# Patient Record
Sex: Female | Born: 1975 | Hispanic: Yes | Marital: Single | Smoking: Never smoker
Health system: Southern US, Community
[De-identification: ages and names within clinical notes are randomized; demographics above are authoritative.]

## PROBLEM LIST (undated history)

## (undated) ENCOUNTER — Inpatient Hospital Stay: Payer: Self-pay

## (undated) DIAGNOSIS — F410 Panic disorder [episodic paroxysmal anxiety] without agoraphobia: Secondary | ICD-10-CM

## (undated) DIAGNOSIS — F419 Anxiety disorder, unspecified: Secondary | ICD-10-CM

---

## 2012-01-15 ENCOUNTER — Emergency Department: Payer: Self-pay | Admitting: *Deleted

## 2012-01-15 LAB — URINALYSIS, COMPLETE
Bilirubin,UR: NEGATIVE
Glucose,UR: NEGATIVE mg/dL (ref 0–75)
Leukocyte Esterase: NEGATIVE
Nitrite: NEGATIVE
Protein: NEGATIVE
RBC,UR: 1 /HPF (ref 0–5)
Squamous Epithelial: 8
WBC UR: 2 /HPF (ref 0–5)

## 2012-02-03 ENCOUNTER — Encounter: Payer: Self-pay | Admitting: Maternal and Fetal Medicine

## 2012-06-10 ENCOUNTER — Observation Stay: Payer: Self-pay | Admitting: Obstetrics and Gynecology

## 2012-06-26 ENCOUNTER — Inpatient Hospital Stay: Payer: Self-pay | Admitting: Obstetrics and Gynecology

## 2012-06-26 LAB — PIH PROFILE
Anion Gap: 6 — ABNORMAL LOW (ref 7–16)
BUN: 10 mg/dL (ref 7–18)
Calcium, Total: 8.8 mg/dL (ref 8.5–10.1)
Creatinine: 0.55 mg/dL — ABNORMAL LOW (ref 0.60–1.30)
EGFR (African American): >60
EGFR (Non-African Amer.): >60
Glucose: 84 mg/dL (ref 65–99)
HCT: 37 % (ref 35.0–47.0)
MCHC: 31.6 g/dL — ABNORMAL LOW (ref 32.0–36.0)
MCV: 85 fL (ref 80–100)
Platelet: 169 10*3/uL (ref 150–440)
Potassium: 4 mmol/L (ref 3.5–5.1)
RBC: 4.33 10*6/uL (ref 3.80–5.20)
SGOT(AST): 18 U/L (ref 15–37)
Sodium: 136 mmol/L (ref 136–145)

## 2014-06-27 LAB — OB RESULTS CONSOLE HEPATITIS B SURFACE ANTIGEN: Hepatitis B Surface Ag: NEGATIVE

## 2014-06-27 LAB — OB RESULTS CONSOLE ABO/RH: RH Type: NEGATIVE

## 2014-06-27 LAB — OB RESULTS CONSOLE RPR: RPR: NONREACTIVE

## 2014-06-27 LAB — OB RESULTS CONSOLE ANTIBODY SCREEN: Antibody Screen: NEGATIVE

## 2014-06-27 LAB — OB RESULTS CONSOLE VARICELLA ZOSTER ANTIBODY, IGG: Varicella: IMMUNE

## 2014-06-27 LAB — OB RESULTS CONSOLE GC/CHLAMYDIA
Chlamydia: NEGATIVE
Gonorrhea: NEGATIVE

## 2014-06-27 LAB — OB RESULTS CONSOLE HIV ANTIBODY (ROUTINE TESTING): HIV: NONREACTIVE

## 2014-06-27 LAB — OB RESULTS CONSOLE RUBELLA ANTIBODY, IGM: RUBELLA: IMMUNE

## 2014-07-22 ENCOUNTER — Encounter: Payer: Self-pay | Admitting: Obstetrics and Gynecology

## 2014-07-26 NOTE — L&D Delivery Note (Signed)
Obstetrical Delivery Note   Date of Delivery:   01/24/2015 Primary OB:   Other [facility] Gestational Age/EDD: 5081w5d (Dated by LMP c/w 12 week US) Antepartum complications: none  Delivered By:   Jolinda Croakavid Goodman MD  Delivery Type:   spontaneous vaginal delivery  Procedure Details:   Uncomplicated SVD Anesthesia:    none Intrapartum complications: None GBS:    Negative Laceration:    none Episiotomy:    none Placenta:    Via active 3rd stage. Intact: yes. To pathology: no.  Estimated Blood Loss:  300 cc  Baby:    Liveborn female, APGARs 8/9, weight PENDING gm  600 mcg of misoprostol given due to parity and increased risk of PPH.   Teena Iraniavid M. Derrill KayGoodman MD, MPH [facility] [phone]

## 2014-08-13 ENCOUNTER — Emergency Department: Payer: Self-pay | Admitting: Emergency Medicine

## 2014-08-13 LAB — URINALYSIS, COMPLETE
BILIRUBIN, UR: NEGATIVE
Bacteria: NONE SEEN
Glucose,UR: 50 mg/dL (ref 0–75)
Ketone: NEGATIVE
LEUKOCYTE ESTERASE: NEGATIVE
Nitrite: NEGATIVE
PH: 6 (ref 4.5–8.0)
Protein: NEGATIVE
Specific Gravity: 1.024 (ref 1.003–1.030)

## 2014-08-13 LAB — LIPASE, BLOOD: LIPASE: 87 U/L (ref 73–393)

## 2014-08-13 LAB — CBC
HCT: 39.3 % (ref 35.0–47.0)
HGB: 12.6 g/dL (ref 12.0–16.0)
MCH: 29.4 pg (ref 26.0–34.0)
MCHC: 32 g/dL (ref 32.0–36.0)
MCV: 92 fL (ref 80–100)
Platelet: 160 10*3/uL (ref 150–440)
RBC: 4.28 10*6/uL (ref 3.80–5.20)
RDW: 14.4 % (ref 11.5–14.5)
WBC: 10.4 10*3/uL (ref 3.6–11.0)

## 2014-08-13 LAB — COMPREHENSIVE METABOLIC PANEL
ALBUMIN: 2.8 g/dL — AB (ref 3.4–5.0)
ALT: 25 U/L
ANION GAP: 10 (ref 7–16)
Alkaline Phosphatase: 58 U/L
BUN: 5 mg/dL — AB (ref 7–18)
Bilirubin,Total: 0.2 mg/dL (ref 0.2–1.0)
CALCIUM: 8.4 mg/dL — AB (ref 8.5–10.1)
Chloride: 108 mmol/L — ABNORMAL HIGH (ref 98–107)
Co2: 21 mmol/L (ref 21–32)
Creatinine: 0.67 mg/dL (ref 0.60–1.30)
EGFR (African American): >60
GLUCOSE: 135 mg/dL — AB (ref 65–99)
Osmolality: 277 (ref 275–301)
POTASSIUM: 3.9 mmol/L (ref 3.5–5.1)
SGOT(AST): 16 U/L (ref 15–37)
Sodium: 139 mmol/L (ref 136–145)
Total Protein: 6.9 g/dL (ref 6.4–8.2)

## 2014-08-13 LAB — HCG, QUANTITATIVE, PREGNANCY: Beta Hcg, Quant.: 23717 m[IU]/mL — ABNORMAL HIGH

## 2014-08-22 ENCOUNTER — Encounter: Payer: Self-pay | Admitting: Obstetrics and Gynecology

## 2014-08-26 ENCOUNTER — Encounter: Payer: Self-pay | Admitting: Obstetrics and Gynecology
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
## 2015-01-10 ENCOUNTER — Encounter: Payer: Self-pay | Admitting: *Deleted

## 2015-01-10 ENCOUNTER — Observation Stay
Admission: EM | Admit: 2015-01-10 | Discharge: 2015-01-10 | Disposition: A | Discharge status: Home / Self Care | Payer: Self-pay | Attending: Obstetrics and Gynecology | Admitting: Obstetrics and Gynecology

## 2015-01-10 DIAGNOSIS — O479 False labor, unspecified: Secondary | ICD-10-CM

## 2015-01-10 NOTE — OB Triage Provider Note (Signed)
NST reactive with 2 accels 15 x 15 BPM.  

## 2015-01-10 NOTE — OB Triage Provider Note (Signed)
Stacy Watson is a 39 y.o. female presenting for "UC's becoming sl uncomfortable today, no ROM, no VB or decresed FM. PNC at [facility] significant for elederly multigravida and needle phobia.  Maternal Medical History:  Reason for admission: Contractions.   Contractions: Onset was 1-2 hours ago.   Frequency: regular.   Duration is approximately 20 seconds.    Fetal activity: Perceived fetal activity is normal.    Prenatal complications: No bleeding, IUGR, polyhydramnios or preterm labor.     OB History    Gravida Para Term Preterm AB TAB SAB Ectopic Multiple Living   7 5 5  0 1  1   5     PMH: depression No past surgical history on file. Family History: family history is not on file. Social History:  reports that she has never smoked. She has never used smokeless tobacco. She reports that she does not drink alcohol or use illicit drugs. Allergies: NKA  Prenatal Transfer Tool  Maternal Diabetes: No Genetic Screening: Normal Maternal Ultrasounds/Referrals: Normal Fetal Ultrasounds or other Referrals:  None Maternal Substance Abuse:  No Significant Maternal Medications:  None Significant Maternal Lab Results:  None Other Comments:  n/a  ROS  Dilation: 1 Effacement (%): Thick Station: Ballotable Exam by:: g kirby rnc Blood pressure 128/62, pulse 114, temperature 98.2 F (36.8 C), temperature source Oral, resp. rate 20, height 5\' 6"  (1.676 m), weight 83.915 kg (185 lb), last menstrual period 04/21/2014. Exam Physical Exam  Constitutional: She is oriented to person, place, and time. She appears well-developed and well-nourished.  HENT:  Head: Normocephalic.  Eyes: Pupils are equal, round, and reactive to light.  Neck: Normal range of motion. Neck supple.  Cardiovascular: Normal rate, regular rhythm and normal heart sounds.   Respiratory: Effort normal and breath sounds normal.  Musculoskeletal: Normal range of motion.  Neurological: She is alert and oriented to person,  place, and time.  Skin: Skin is warm and dry.  Psychiatric: She has a normal mood and affect.    Prenatal labs: ABO, Rh: O/Negative/-- (12/03 0000) Antibody: Negative (12/03 0000) Rubella: Immune (12/03 0000) RPR: Nonreactive (12/03 0000)  HBsAg: Negative (12/03 0000)  HIV: Non-reactive (12/03 0000)  GBS:  neg Assessment/Plan: A: 1. IUP at 37 weeks 2. Elderly Multip with B-H's P: DC home to rest 2. Fu with decreased FM, UC's becoming more intense, ROM or VB.  Stacy Watson W 01/10/2015, 1:08 PM

## 2015-01-10 NOTE — OB Triage Note (Signed)
Presents with complaints of contractions that started yesterday, now every 10 minutes. States some mucous discharge

## 2015-01-22 NOTE — H&P (Signed)
Stacy Watson, Stacy Watson Female 04/10/1976 [id number]    OB Triage Provider Note by Sharee Pimplearon W Sarra Rachels, CNM at 01/10/2015 1:08 PM    Author: Sharee Pimplearon W Stephaun Million, CNM Service: Obstetrics Author Type: Certified Midwife   Filed: 01/10/2015 1:18 PM Note Time: 01/10/2015 1:08 PM Status: Signed   Editor: Sharee Pimplearon W Davarius Ridener, CNM (Certified Midwife)     Related Notes: Original Note by Sharee Pimplearon W Keyetta Hollingworth, CNM (Certified Midwife) filed at 01/10/2015 1:17 PM   Cosigner: Suzy Bouchardhomas J Schermerhorn, MD at 01/14/2015 9:08 AM       Expand All Collapse All   Stacy Watson is a 39 y.o. female presenting for "UC's becoming sl uncomfortable today, no ROM, no VB or decresed FM. PNC at [facility] significant for elederly multigravida and needle phobia.  Maternal Medical History:  Reason for admission: Contractions.   Contractions: Onset was 1-2 hours ago.  Frequency: regular.  Duration is approximately 20 seconds.   Fetal activity: Perceived fetal activity is normal.   Prenatal complications: No bleeding, IUGR, polyhydramnios or preterm labor.   OB History    Gravida Para Term Preterm AB TAB SAB Ectopic Multiple Living   7 5 5  0 1  1   5     PMH: depression No past surgical history on file. Family History: family history is not on file. Social History:  reports that she has never smoked. She has never used smokeless tobacco. She reports that she does not drink alcohol or use illicit drugs. Allergies: NKA  Prenatal Transfer Tool  Maternal Diabetes: No Genetic Screening: Normal Maternal Ultrasounds/Referrals: Normal Fetal Ultrasounds or other Referrals: None Maternal Substance Abuse: No Significant Maternal Medications: None Significant Maternal Lab Results: None Other Comments: n/a  ROS  Dilation: 1 Effacement (%): Thick Station: Ballotable Exam by:: g kirby rnc Blood pressure 128/62, pulse 114, temperature 98.2 F (36.8 C), temperature source Oral, resp. rate 20, height  5\' 6"  (1.676 m), weight 83.915 kg (185 lb), last menstrual period 04/21/2014. Exam Physical Exam  Constitutional: She is oriented to person, place, and time. She appears well-developed and well-nourished.  HENT:  Head: Normocephalic.  Eyes: Pupils are equal, round, and reactive to light.  Neck: Normal range of motion. Neck supple.  Cardiovascular: Normal rate, regular rhythm and normal heart sounds.  Respiratory: Effort normal and breath sounds normal.  Musculoskeletal: Normal range of motion.  Neurological: She is alert and oriented to person, place, and time.  Skin: Skin is warm and dry.  Psychiatric: She has a normal mood and affect.    Prenatal labs: ABO, Rh: O/Negative/-- (12/03 0000) Antibody: Negative (12/03 0000) Rubella: Immune (12/03 0000) RPR: Nonreactive (12/03 0000)  HBsAg: Negative (12/03 0000)  HIV: Non-reactive (12/03 0000)  GBS:  neg Assessment/Plan: A: 1. IUP at 37 weeks 2. Elderly Multip with B-H's P: DC home to rest 2. Fu with decreased FM, UC's becoming more intense, ROM or VB.  Schyler Counsell W 01/10/2015, 1:08 PM

## 2015-01-24 ENCOUNTER — Inpatient Hospital Stay
Admission: EM | Admit: 2015-01-24 | Discharge: 2015-01-26 | DRG: 767 | Disposition: A | Discharge status: Home / Self Care | Payer: Medicaid Other | Attending: Obstetrics and Gynecology | Admitting: Obstetrics and Gynecology

## 2015-01-24 ENCOUNTER — Encounter: Payer: Self-pay | Admitting: *Deleted

## 2015-01-24 DIAGNOSIS — Z3A39 39 weeks gestation of pregnancy: Secondary | ICD-10-CM | POA: Diagnosis present

## 2015-01-24 DIAGNOSIS — Z302 Encounter for sterilization: Secondary | ICD-10-CM

## 2015-01-24 DIAGNOSIS — Z3483 Encounter for supervision of other normal pregnancy, third trimester: Secondary | ICD-10-CM | POA: Diagnosis present

## 2015-01-24 LAB — TYPE AND SCREEN
ABO/RH(D): O POS
Antibody Screen: NEGATIVE

## 2015-01-24 LAB — CBC
HEMATOCRIT: 35.4 % (ref 35.0–47.0)
Hemoglobin: 11.5 g/dL — ABNORMAL LOW (ref 12.0–16.0)
MCH: 27.9 pg (ref 26.0–34.0)
MCHC: 32.6 g/dL (ref 32.0–36.0)
MCV: 85.7 fL (ref 80.0–100.0)
Platelets: 140 10*3/uL — ABNORMAL LOW (ref 150–440)
RBC: 4.13 MIL/uL (ref 3.80–5.20)
RDW: 17 % — ABNORMAL HIGH (ref 11.5–14.5)
WBC: 11.3 10*3/uL — ABNORMAL HIGH (ref 3.6–11.0)

## 2015-01-24 LAB — ABO/RH: ABO/RH(D): O POS

## 2015-01-24 SURGERY — Surgical Case
Anesthesia: *Unknown

## 2015-01-24 MED ORDER — BUTORPHANOL TARTRATE 1 MG/ML IJ SOLN: 1.0000 mg | INTRAMUSCULAR | Status: DC | PRN

## 2015-01-24 MED ORDER — OXYTOCIN 10 UNIT/ML IJ SOLN
INTRAMUSCULAR | Status: AC
  Filled 2015-01-24: qty 2

## 2015-01-24 MED ORDER — LIDOCAINE HCL (PF) 1 % IJ SOLN
30.0000 mL | INTRAMUSCULAR | Status: DC | PRN
Start: 2015-01-24 — End: 2015-01-24
  Filled 2015-01-24: qty 30

## 2015-01-24 MED ORDER — OXYTOCIN 40 UNITS IN LACTATED RINGERS INFUSION - SIMPLE MED
INTRAVENOUS | Status: AC
  Filled 2015-01-24: qty 1000

## 2015-01-24 MED ORDER — ACETAMINOPHEN 325 MG PO TABS
ORAL_TABLET | ORAL | Status: AC
  Administered 2015-01-24: 650 mg via ORAL
  Filled 2015-01-24: qty 2

## 2015-01-24 MED ORDER — ONDANSETRON HCL 4 MG/2ML IJ SOLN
4.0000 mg | Freq: Four times a day (QID) | INTRAMUSCULAR | Status: DC | PRN
Start: 2015-01-24 — End: 2015-01-24

## 2015-01-24 MED ORDER — OXYCODONE-ACETAMINOPHEN 5-325 MG PO TABS
2.0000 | ORAL_TABLET | ORAL | Status: DC | PRN
  Administered 2015-01-24 – 2015-01-26 (×3): 2 via ORAL
  Filled 2015-01-24 (×3): qty 2

## 2015-01-24 MED ORDER — WITCH HAZEL-GLYCERIN EX PADS: 1.0000 "application " | MEDICATED_PAD | CUTANEOUS | Status: DC | PRN

## 2015-01-24 MED ORDER — PRENATAL MULTIVITAMIN CH: 1.0000 | ORAL_TABLET | Freq: Every day | ORAL | Status: DC

## 2015-01-24 MED ORDER — LANOLIN HYDROUS EX OINT: TOPICAL_OINTMENT | CUTANEOUS | Status: DC | PRN

## 2015-01-24 MED ORDER — ONDANSETRON HCL 4 MG PO TABS: 4.0000 mg | ORAL_TABLET | ORAL | Status: DC | PRN

## 2015-01-24 MED ORDER — LIDOCAINE HCL (PF) 1 % IJ SOLN
INTRAMUSCULAR | Status: AC
  Filled 2015-01-24: qty 30

## 2015-01-24 MED ORDER — CITRIC ACID-SODIUM CITRATE 334-500 MG/5ML PO SOLN: 30.0000 mL | ORAL | Status: DC | PRN

## 2015-01-24 MED ORDER — LACTATED RINGERS IV SOLN: INTRAVENOUS | Status: DC

## 2015-01-24 MED ORDER — OXYTOCIN 40 UNITS IN LACTATED RINGERS INFUSION - SIMPLE MED
62.5000 mL/h | INTRAVENOUS | Status: DC
  Administered 2015-01-24: 62.5 mL/h via INTRAVENOUS

## 2015-01-24 MED ORDER — SIMETHICONE 80 MG PO CHEW: 80.0000 mg | CHEWABLE_TABLET | ORAL | Status: DC | PRN

## 2015-01-24 MED ORDER — LACTATED RINGERS IV SOLN: 500.0000 mL | INTRAVENOUS | Status: DC | PRN

## 2015-01-24 MED ORDER — IBUPROFEN 600 MG PO TABS
600.0000 mg | ORAL_TABLET | Freq: Four times a day (QID) | ORAL | Status: DC
  Administered 2015-01-24 – 2015-01-26 (×6): 600 mg via ORAL
  Filled 2015-01-24 (×6): qty 1

## 2015-01-24 MED ORDER — OXYTOCIN BOLUS FROM INFUSION: 250.0000 mL | INTRAVENOUS | Status: DC

## 2015-01-24 MED ORDER — ONDANSETRON HCL 4 MG/2ML IJ SOLN
4.0000 mg | INTRAMUSCULAR | Status: DC | PRN
  Administered 2015-01-25: 4 mg via INTRAVENOUS

## 2015-01-24 MED ORDER — DIPHENHYDRAMINE HCL 25 MG PO CAPS: 25.0000 mg | ORAL_CAPSULE | Freq: Four times a day (QID) | ORAL | Status: DC | PRN

## 2015-01-24 MED ORDER — SODIUM CHLORIDE 0.9 % IV SOLN
INTRAVENOUS | Status: DC
  Administered 2015-01-25 (×2): via INTRAVENOUS

## 2015-01-24 MED ORDER — ACETAMINOPHEN 325 MG PO TABS
650.0000 mg | ORAL_TABLET | ORAL | Status: DC | PRN
  Administered 2015-01-24: 650 mg via ORAL

## 2015-01-24 MED ORDER — BENZOCAINE-MENTHOL 20-0.5 % EX AERO: 1.0000 "application " | INHALATION_SPRAY | CUTANEOUS | Status: DC | PRN

## 2015-01-24 MED ORDER — DIBUCAINE 1 % RE OINT: 1.0000 "application " | TOPICAL_OINTMENT | RECTAL | Status: DC | PRN

## 2015-01-24 MED ORDER — ZOLPIDEM TARTRATE 5 MG PO TABS: 5.0000 mg | ORAL_TABLET | Freq: Every evening | ORAL | Status: DC | PRN

## 2015-01-24 MED ORDER — MISOPROSTOL 200 MCG PO TABS
ORAL_TABLET | ORAL | Status: AC
  Administered 2015-01-24: 600 ug
  Filled 2015-01-24: qty 4

## 2015-01-24 MED ORDER — OXYTOCIN 40 UNITS IN LACTATED RINGERS INFUSION - SIMPLE MED: 125.0000 mL/h | INTRAVENOUS | Status: DC | PRN

## 2015-01-24 MED ORDER — OXYCODONE-ACETAMINOPHEN 5-325 MG PO TABS
1.0000 | ORAL_TABLET | ORAL | Status: DC | PRN
  Administered 2015-01-25 – 2015-01-26 (×2): 1 via ORAL
  Filled 2015-01-24 (×2): qty 1

## 2015-01-24 MED ORDER — SENNOSIDES-DOCUSATE SODIUM 8.6-50 MG PO TABS
2.0000 | ORAL_TABLET | ORAL | Status: DC
  Administered 2015-01-25 – 2015-01-26 (×2): 2 via ORAL
  Filled 2015-01-24 (×2): qty 2

## 2015-01-24 MED ORDER — TETANUS-DIPHTH-ACELL PERTUSSIS 5-2.5-18.5 LF-MCG/0.5 IM SUSP: 0.5000 mL | Freq: Once | INTRAMUSCULAR | Status: DC

## 2015-01-24 MED ORDER — AMMONIA AROMATIC IN INHA
RESPIRATORY_TRACT | Status: AC
  Filled 2015-01-24: qty 10
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
## 2015-01-24 NOTE — Progress Notes (Signed)
S: Resting comfortably without epidural. + CTX, no LOF, VB O: Temp:  [97.7 F (36.5 C)-98.7 F (37.1 C)] 98.7 F (37.1 C) (07/01 0722) Pulse Rate:  [107-116] 112 (07/01 0722) Resp:  [18-22] 22 (07/01 0722) BP: (128-138)/(68-90) 128/90 mmHg (07/01 0722) SpO2:  [98 %-99 %] 98 % (07/01 0755) Weight:  [83.915 kg (185 lb)] 83.915 kg (185 lb) (07/01 0634)   Gen: NAD, AAOx3      Abd: FNTTP      Ext: Non-tender, Nonedmeatous    FHT: 140s mod var + accelerations no decelerations TOCO: Q2-3 min SVE: 4/50/-2   A/P:  39 y.o. yo [id number] at 579w5d by LMP c/w 12 week US with labor.   Labor: AROM'd clear, head well applied  FWB: Reassuring Cat 1 tracing. EFW 7.5lbs  GBS: neg  Contraception: not discussed   GOODMAN, DAVID MICHAEL 9:58 AM

## 2015-01-25 ENCOUNTER — Encounter
Admission: EM | Disposition: A | Discharge status: Home / Self Care | Payer: Self-pay | Source: Home / Self Care | Attending: Obstetrics and Gynecology

## 2015-01-25 ENCOUNTER — Inpatient Hospital Stay: Payer: Medicaid Other | Admitting: Anesthesiology

## 2015-01-25 ENCOUNTER — Encounter: Payer: Self-pay | Admitting: Anesthesiology

## 2015-01-25 HISTORY — PX: TUBAL LIGATION: SHX77

## 2015-01-25 LAB — CBC
HEMATOCRIT: 34.8 % — AB (ref 35.0–47.0)
HEMOGLOBIN: 11 g/dL — AB (ref 12.0–16.0)
MCH: 27.4 pg (ref 26.0–34.0)
MCHC: 31.6 g/dL — ABNORMAL LOW (ref 32.0–36.0)
MCV: 86.5 fL (ref 80.0–100.0)
PLATELETS: 128 10*3/uL — AB (ref 150–440)
RBC: 4.02 MIL/uL (ref 3.80–5.20)
RDW: 17.4 % — ABNORMAL HIGH (ref 11.5–14.5)
WBC: 11.5 10*3/uL — ABNORMAL HIGH (ref 3.6–11.0)

## 2015-01-25 LAB — RPR: RPR: NONREACTIVE

## 2015-01-25 SURGERY — LIGATION, FALLOPIAN TUBE, BILATERAL
Anesthesia: General | Laterality: Bilateral

## 2015-01-25 MED ORDER — BUPIVACAINE HCL 0.5 % IJ SOLN
INTRAMUSCULAR | Status: DC | PRN
  Administered 2015-01-25: 3 mL

## 2015-01-25 MED ORDER — FENTANYL CITRATE (PF) 100 MCG/2ML IJ SOLN
25.0000 ug | INTRAMUSCULAR | Status: DC | PRN
  Administered 2015-01-25 (×2): 25 ug via INTRAVENOUS

## 2015-01-25 MED ORDER — BUPIVACAINE HCL (PF) 0.5 % IJ SOLN
INTRAMUSCULAR | Status: AC
  Filled 2015-01-25: qty 30

## 2015-01-25 MED ORDER — DEXAMETHASONE SODIUM PHOSPHATE 4 MG/ML IJ SOLN
INTRAMUSCULAR | Status: DC | PRN
  Administered 2015-01-25: 10 mg via INTRAVENOUS

## 2015-01-25 MED ORDER — FENTANYL CITRATE (PF) 100 MCG/2ML IJ SOLN
INTRAMUSCULAR | Status: DC | PRN
  Administered 2015-01-25: 100 ug via INTRAVENOUS

## 2015-01-25 MED ORDER — LIDOCAINE HCL (CARDIAC) 20 MG/ML IV SOLN
INTRAVENOUS | Status: DC | PRN
  Administered 2015-01-25: 100 mg via INTRAVENOUS

## 2015-01-25 MED ORDER — GLYCOPYRROLATE 0.2 MG/ML IJ SOLN
INTRAMUSCULAR | Status: DC | PRN
  Administered 2015-01-25: 0.6 mg via INTRAVENOUS

## 2015-01-25 MED ORDER — FENTANYL CITRATE (PF) 100 MCG/2ML IJ SOLN
INTRAMUSCULAR | Status: AC
  Filled 2015-01-25: qty 2

## 2015-01-25 MED ORDER — ROCURONIUM BROMIDE 100 MG/10ML IV SOLN
INTRAVENOUS | Status: DC | PRN
  Administered 2015-01-25: 30 mg via INTRAVENOUS
  Administered 2015-01-25: 10 mg via INTRAVENOUS

## 2015-01-25 MED ORDER — ONDANSETRON HCL 4 MG/2ML IJ SOLN: 4.0000 mg | Freq: Once | INTRAMUSCULAR | Status: DC | PRN

## 2015-01-25 MED ORDER — NEOSTIGMINE METHYLSULFATE 10 MG/10ML IV SOLN
INTRAVENOUS | Status: DC | PRN
  Administered 2015-01-25: 4 mg via INTRAVENOUS

## 2015-01-25 MED ORDER — SUCCINYLCHOLINE CHLORIDE 20 MG/ML IJ SOLN
INTRAMUSCULAR | Status: DC | PRN
  Administered 2015-01-25: 100 mg via INTRAVENOUS

## 2015-01-25 MED ORDER — PROPOFOL 10 MG/ML IV BOLUS
INTRAVENOUS | Status: DC | PRN
  Administered 2015-01-25: 200 mg via INTRAVENOUS

## 2015-01-25 MED ORDER — MIDAZOLAM HCL 2 MG/2ML IJ SOLN
INTRAMUSCULAR | Status: DC | PRN
  Administered 2015-01-25: 2 mg via INTRAVENOUS

## 2015-01-25 MED ORDER — LACTATED RINGERS IV SOLN
INTRAVENOUS | Status: DC | PRN
  Administered 2015-01-25: 17:00:00 via INTRAVENOUS

## 2015-01-25 SURGICAL SUPPLY — 30 items
CHLORAPREP W/TINT 26ML (MISCELLANEOUS) ×3 IMPLANT
DERMABOND ADVANCED (GAUZE/BANDAGES/DRESSINGS) ×2
DERMABOND ADVANCED .7 DNX12 (GAUZE/BANDAGES/DRESSINGS) ×1 IMPLANT
DRAPE LAPAROTOMY T 102X78X121 (DRAPES) ×3 IMPLANT
DRESSING TELFA 4X3 1S ST N-ADH (GAUZE/BANDAGES/DRESSINGS) ×3 IMPLANT
DRSG TEGADERM 2-3/8X2-3/4 SM (GAUZE/BANDAGES/DRESSINGS) IMPLANT
DRSG TEGADERM 2X2.25 PEDS (GAUZE/BANDAGES/DRESSINGS) ×3 IMPLANT
ELECT CAUTERY BLADE 6.4 (BLADE) ×3 IMPLANT
GAUZE SPONGE NON-WVN 2X2 STRL (MISCELLANEOUS) ×1 IMPLANT
GLOVE BIO SURGEON STRL SZ8 (GLOVE) IMPLANT
GOWN STRL REUS W/ TWL LRG LVL3 (GOWN DISPOSABLE) ×2 IMPLANT
GOWN STRL REUS W/ TWL XL LVL3 (GOWN DISPOSABLE) IMPLANT
GOWN STRL REUS W/TWL LRG LVL3 (GOWN DISPOSABLE) ×4
GOWN STRL REUS W/TWL XL LVL3 (GOWN DISPOSABLE)
LABEL OR SOLS (LABEL) ×3 IMPLANT
LIQUID BAND (GAUZE/BANDAGES/DRESSINGS) ×3 IMPLANT
NDL SAFETY 22GX1.5 (NEEDLE) ×3 IMPLANT
NS IRRIG 500ML POUR BTL (IV SOLUTION) ×3 IMPLANT
PACK BASIN MINOR ARMC (MISCELLANEOUS) ×3 IMPLANT
PAD GROUND ADULT SPLIT (MISCELLANEOUS) ×3 IMPLANT
SPONGE LAP 4X18 5PK (MISCELLANEOUS) ×3 IMPLANT
SPONGE VERSALON 2X2 STRL (MISCELLANEOUS) ×2
STRAP SAFETY BODY (MISCELLANEOUS) ×3 IMPLANT
SUT PLAIN 2 0 (SUTURE) ×4
SUT PLAIN ABS 2-0 CT1 27XMFL (SUTURE) ×2 IMPLANT
SUT VIC AB 0 SH 27 (SUTURE) IMPLANT
SUT VIC AB 2-0 UR6 27 (SUTURE) IMPLANT
SUT VIC AB 4-0 PS2 18 (SUTURE) IMPLANT
SUT VICRYL 0 AB UR-6 (SUTURE) ×6 IMPLANT
SYRINGE 10CC LL (SYRINGE) ×3 IMPLANT

## 2015-01-25 NOTE — Op Note (Signed)
Operatative Note  Pre-Op Dx:   Desires Sterilization  Post-Op Dx:   same  Procedure:   Bilateral tubal ligation  Surgeon:  Jolinda Croakavid Goodman MD, MPH Assistant(s):  None Staff:  Circulator: Casimiro NeedleSharon G Moore, RN Scrub Person: Windell NorfolkKari S Pedersen  Anesthesia:  GETA  Antibiotics:  None  DVT prophylaxis: SCDs   EBL:  25cc   Specimen(s):    ID Type Source Tests Collected by Time Destination  1 : left fallopian tube segment GYN Fallopian Tube, Left SURGICAL PATHOLOGY Wille Celesteavid Michael Goodman, MD 01/25/2015 1715   2 : right fallopian tube segment GYN Fallopian Tube, Right SURGICAL PATHOLOGY Wille Celesteavid Michael Goodman, MD 01/25/2015 1710     Findings:  ASA 2. Wound class 1 Uterus: Postpartum with fundus near umbilicus Ovaries and Tubes: Edematous tubes bilaterally Adhesions: None  Complications:  none   Disposition:  Extubated to PACU.   Tech The patient was taken to the operating room where her general anesthesia was found to be adequate.  She was then placed in the dorsal supine position and prepped and draped in sterile fashion.  After an adequate timeout was performed, attention was turned to the patient's abdomen where the skin was infiltrated with 4 cc of 0.5% bupivacaine and a small transverse skin incision was made under the umbilical fold. The incision was taken down to the layer of fascia using the scalpel, and fascia was incised, and extended vertically using Mayo scissors. The peritoneum was entered in a sharp fashion. Attention was then turned to the patient's uterus, and left fallopian tube was identified and followed out to the fimbriated end.  The tube was grasped 3 cm from the cornual region and doubly ligated using 2-0 plain gut suture. Hemostasis was noted.   A similar process was carried out on the right side allowing for bilateral tubal sterilization.  Good hemostasis was noted overall. .The instruments were then removed from the patient's abdomen and the fascial incision was  repaired with 0 Vicryl, and the skin was closed with a 4-0 Vicryl subcuticular stitch. The patient tolerated the procedure well.  Instrument, sponge, and needle counts were correct times two.  The patient was then taken to the recovery room awake and in stable condition.   Teena Iraniavid M. Derrill KayGoodman MD, MPH [facility] [phone]

## 2015-01-25 NOTE — Anesthesia Preprocedure Evaluation (Signed)
Anesthesia Evaluation  Patient identified by MRN, date of birth, ID band Patient awake    Reviewed: Allergy & Precautions, Patient's Chart, lab work & pertinent test results  Airway Mallampati: III  TM Distance: >3 FB Neck ROM: Full    Dental  (+) Caps, Chipped   Pulmonary neg pulmonary ROS,  breath sounds clear to auscultation  Pulmonary exam normal       Cardiovascular negative cardio ROS Normal cardiovascular exam    Neuro/Psych negative neurological ROS     GI/Hepatic negative GI ROS, Neg liver ROS,   Endo/Other  negative endocrine ROS  Renal/GU negative Renal ROS  negative genitourinary   Musculoskeletal negative musculoskeletal ROS (+)   Abdominal Normal abdominal exam  (+) + obese,   Peds negative pediatric ROS (+)  Hematology  (+) anemia ,   Anesthesia Other Findings   Reproductive/Obstetrics                             Anesthesia Physical Anesthesia Plan  ASA: II  Anesthesia Plan: General   Post-op Pain Management:    Induction: Intravenous and Rapid sequence  Airway Management Planned: Oral ETT  Additional Equipment:   Intra-op Plan:   Post-operative Plan: Extubation in OR  Informed Consent: I have reviewed the patients History and Physical, chart, labs and discussed the procedure including the risks, benefits and alternatives for the proposed anesthesia with the patient or authorized representative who has indicated his/her understanding and acceptance.   Dental advisory given  Plan Discussed with: Surgeon  Anesthesia Plan Comments:         Anesthesia Quick Evaluation

## 2015-01-25 NOTE — Anesthesia Postprocedure Evaluation (Signed)
  Anesthesia Post-op Note  Patient: [name]  Procedure(s) Performed: Procedure(s): BILATERAL TUBAL LIGATION (Bilateral)  Anesthesia type:General  Patient location: PACU  Post pain: Pain level controlled  Post assessment: Post-op Vital signs reviewed, Patient's Cardiovascular Status Stable, Respiratory Function Stable, Patent Airway and No signs of Nausea or vomiting  Post vital signs: Reviewed and stable  Last Vitals:  Filed Vitals:   01/25/15 1811  BP: 112/91  Pulse:   Temp:   Resp:     Level of consciousness: awake, alert  and patient cooperative  Complications: No apparent anesthesia complications

## 2015-01-25 NOTE — Progress Notes (Signed)
Post Partum Day 1 Subjective: up ad lib, voiding, tolerating PO, + flatus and Concerned about bilateral pain in quadraceps making it hard to walk or stand  Objective: Blood pressure 123/80, pulse 98, temperature 97.8 F (36.6 C), temperature source Oral, resp. rate 18, height 5\' 6"  (1.676 m), weight 83.915 kg (185 lb), last menstrual period 04/21/2014, SpO2 100 %, unknown if currently breastfeeding.  Physical Exam:  General: alert, cooperative and no distress Lochia: appropriate Uterine Fundus: firm Incision: None DVT Evaluation: No evidence of DVT seen on physical exam. Negative Holman's. Sensation intact, Normal reflexes   Recent Labs  01/24/15 0652 01/25/15 0456  HGB 11.5* 11.0*  HCT 35.4 34.8*    Assessment/Plan: Plan for discharge tomorrow, Discharge home, Breastfeeding and Contraception Desires permanent sterilization - Discussed risks/ benefits/alternatives to permanent sterilization and reviewed including alternative LARC methods. Risks include but aren't limited to regret, bleeding, infection, damage to surrounding organs, <1% failure rate, and high risk of ectopic pregnancy in setting of failure. Pt instructed to seek MD if she should ever think that she is pregnant in the future. Tubal papers signed today, self-pay.     LOS: 1 day   GOODMAN, DAVID MICHAEL 01/25/2015, 9:19 AM

## 2015-01-25 NOTE — Transfer of Care (Signed)
Immediate Anesthesia Transfer of Care Note  Patient: Stacy Watson  Procedure(s) Performed: Procedure(s): BILATERAL TUBAL LIGATION (Bilateral)  Patient Location: PACU  Anesthesia Type:General  Level of Consciousness: awake  Airway & Oxygen Therapy: Patient Spontanous Breathing  Post-op Assessment: Report given to RN  Post vital signs: stable  Last Vitals:  Filed Vitals:   01/25/15 1224  BP:   Pulse:   Temp: 36.3 C  Resp:     Complications: No apparent anesthesia complications

## 2015-01-25 NOTE — Anesthesia Procedure Notes (Signed)
Procedure Name: Intubation Date/Time: 01/25/2015 4:54 PM Performed by: Rosaria FerriesPISCITELLO, JOSEPH K Pre-anesthesia Checklist: Patient identified, Patient being monitored, Timeout performed, Emergency Drugs available and Suction available Patient Re-evaluated:Patient Re-evaluated prior to inductionOxygen Delivery Method: Circle system utilized Preoxygenation: Pre-oxygenation with 100% oxygen Intubation Type: IV induction Ventilation: Mask ventilation without difficulty Laryngoscope Size: Mac and 3 Grade View: Grade II Tube type: Oral Tube size: 7.0 mm Number of attempts: 1 Airway Equipment and Method: Stylet Placement Confirmation: ETT inserted through vocal cords under direct vision,  positive ETCO2 and breath sounds checked- equal and bilateral Secured at: 21 cm Tube secured with: Tape Dental Injury: Teeth and Oropharynx as per pre-operative assessment

## 2015-01-26 LAB — CBC
HCT: 32.8 % — ABNORMAL LOW (ref 35.0–47.0)
Hemoglobin: 10.5 g/dL — ABNORMAL LOW (ref 12.0–16.0)
MCH: 27.6 pg (ref 26.0–34.0)
MCHC: 31.9 g/dL — ABNORMAL LOW (ref 32.0–36.0)
MCV: 86.4 fL (ref 80.0–100.0)
Platelets: 150 10*3/uL (ref 150–440)
RBC: 3.8 MIL/uL (ref 3.80–5.20)
RDW: 17.5 % — ABNORMAL HIGH (ref 11.5–14.5)
WBC: 14.7 10*3/uL — AB (ref 3.6–11.0)

## 2015-01-26 MED ORDER — OXYCODONE-ACETAMINOPHEN 5-325 MG PO TABS: 1.0000 | ORAL_TABLET | ORAL | Status: AC | PRN

## 2015-01-26 MED ORDER — IBUPROFEN 200 MG PO TABS: 600.0000 mg | ORAL_TABLET | Freq: Three times a day (TID) | ORAL | Status: AC | PRN

## 2015-01-26 NOTE — Discharge Instructions (Signed)
FU at 6 weeks [facility]. Pt had BTL. No sex or anything in the vagina x 6 weeks. No driving x 2 weeks

## 2015-01-26 NOTE — Discharge Summary (Signed)
Obstetric Discharge Summary Reason for Admission: onset of labor Prenatal Procedures: ultrasound Intrapartum Procedures: spontaneous vaginal delivery Postpartum Procedures: P.P. tubal ligation Complications-Operative and Postpartum: none HEMOGLOBIN  Date Value Ref Range Status  01/26/2015 10.5* 12.0 - 16.0 g/dL Final   HGB  Date Value Ref Range Status  08/13/2014 12.6 12.0-16.0 g/dL Final   HCT  Date Value Ref Range Status  01/26/2015 32.8* 35.0 - 47.0 % Final  08/13/2014 39.3 35.0-47.0 % Final    Physical Exam:  General: alert Lochia: appropriate Uterine Fundus: firm Incision: healing well DVT Evaluation: No evidence of DVT seen on physical exam.  Discharge Diagnoses: Term Pregnancy-delivered  Discharge Information: Date: 01/26/2015 Activity: unrestricted Diet: routine Medications: PNV, Ibuprofen and Vicodin Condition: stable Instructions: call for fever, pain that is not resolving with meds, worsening of bleeding or any other concerns Discharge to: home   Newborn Data: Live born female  Birth Weight: 8 lb 3.6 oz (3730 g) APGAR: 9, 9  Home with mother.  Milon ScoreJONES, Tommy Minichiello W 01/26/2015, 9:41 AM

## 2015-01-28 ENCOUNTER — Encounter: Payer: Self-pay | Admitting: Obstetrics and Gynecology

## 2015-01-29 LAB — SURGICAL PATHOLOGY

## 2015-02-06 NOTE — Addendum Note (Signed)
Addendum  created 02/06/15 1419 by Rosaria FerriesJoseph K Jaykub Mackins, MD   Modules edited: Anesthesia Events, Narrator   Narrator:  Narrator: Event Log Edited

## 2015-05-29 ENCOUNTER — Emergency Department
Admission: EM | Admit: 2015-05-29 | Discharge: 2015-05-29 | Disposition: A | Discharge status: Home / Self Care | Payer: Self-pay | Attending: Emergency Medicine | Admitting: Emergency Medicine

## 2015-05-29 ENCOUNTER — Other Ambulatory Visit: Payer: Self-pay

## 2015-05-29 ENCOUNTER — Encounter: Payer: Self-pay | Admitting: *Deleted

## 2015-05-29 DIAGNOSIS — F419 Anxiety disorder, unspecified: Secondary | ICD-10-CM | POA: Insufficient documentation

## 2015-05-29 LAB — BASIC METABOLIC PANEL
ANION GAP: 9 (ref 5–15)
BUN: 13 mg/dL (ref 6–20)
CO2: 27 mmol/L (ref 22–32)
Calcium: 10.1 mg/dL (ref 8.9–10.3)
Chloride: 104 mmol/L (ref 101–111)
Creatinine, Ser: 0.88 mg/dL (ref 0.44–1.00)
GFR calc Af Amer: >60 mL/min (ref >60)
GFR calc non Af Amer: >60 mL/min (ref >60)
GLUCOSE: 79 mg/dL (ref 65–99)
POTASSIUM: 3.9 mmol/L (ref 3.5–5.1)
Sodium: 140 mmol/L (ref 135–145)

## 2015-05-29 LAB — CBC
HEMATOCRIT: 44.2 % (ref 35.0–47.0)
Hemoglobin: 15.1 g/dL (ref 12.0–16.0)
MCH: 30.1 pg (ref 26.0–34.0)
MCHC: 34.1 g/dL (ref 32.0–36.0)
MCV: 88 fL (ref 80.0–100.0)
Platelets: 177 10*3/uL (ref 150–440)
RBC: 5.03 MIL/uL (ref 3.80–5.20)
RDW: 14.1 % (ref 11.5–14.5)
WBC: 10.6 10*3/uL (ref 3.6–11.0)

## 2015-05-29 MED ORDER — LORAZEPAM 0.5 MG PO TABS: 0.5000 mg | ORAL_TABLET | Freq: Three times a day (TID) | ORAL | Status: AC | PRN

## 2015-05-29 NOTE — ED Notes (Signed)
Pt reports numbness in right arm and pain in arm.  Pt denies CP.  Pt sts that she is feeling tensed up.
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
## 2015-05-29 NOTE — ED Notes (Signed)
[facility]. Patient discharge and follow up information reviewed with patient by ED nursing staff and patient given the opportunity to ask questions pertaining to ED visit and discharge plan of care. Patient advised that should symptoms not continue to improve, resolve entirely, or should new symptoms develop then a follow up visit with their PCP or a return visit to the ED may be warranted. Patient verbalized consent and understanding of discharge plan of care including potential need for further evaluation. Patient being discharged in stable condition per attending ED physician on duty.

## 2015-05-29 NOTE — Discharge Instructions (Signed)
Please take your medication as needed for anxiety. As we discussed please discard your breast milk for approximately 24 hours after taking this medication before resuming breast-feeding. Please return to the emergency department for any other concerns you may have.    Crisis de [place] (Panic Attacks) Las crisis de [place] son ataques repentinos y [place] [address], [place] o Dentist extremos. Es posible que ocurran sin motivo, cuando est relajado, ansioso o cuando duerme. Las crisis de [place] pueden ocurrir por algunas de estas razones:   En ocasiones, las personas sanas presentan crisis de [place] en situaciones extremas, potencialmente mortales, como la guerra o los desastres naturales. La ansiedad normal es un mecanismo de defensa del cuerpo que nos ayuda a Publishing rights manager ante situaciones de peligro (respuesta de defensa o huida).  Con frecuencia, las crisis de [place] aparecen acompaadas de trastornos de ansiedad, como trastorno de pnico, trastorno de ansiedad social, trastorno de ansiedad generalizada y fobias. Los trastornos de ansiedad provocan ansiedad excesiva o incontrolable. Sus relaciones y [address] pueden verse Education officer, environmental.  En ocasiones, las crisis de ansiedad se presentan con otras enfermedades mentales, como la depresin y el trastorno por estrs postraumtico.  Algunas enfermedades, medicamentos recetados y drogas pueden provocar crisis de [place]. SNTOMAS  Las crisis de [place] comienzan repentinamente, Writer punto mximo a los 20 minutos y se presentan junto con cuatro o ms de los siguientes sntomas:  Latidos cardacos acelerados o frecuencia cardaca elevada (palpitaciones).  Sudoracin.  Temblores o sacudidas.  Dificultad para respirar o sensacin de asfixia.  Sensacin de [employer].  Dolor o International aid/development worker.  Nuseas o sensacin extraa en el estmago.  Mareos, sensacin de desvanecimiento o de desmayo.  Escalofros o sofocos.  Hormigueos  o adormecimiento en los labios o las manos y los pies.  Sensacin de [employer] no son reales o de que no es usted mismo.  Temor a perder el control o el juicio.  Temor a Musician. Algunos de estos sntomas pueden parecerse a enfermedades graves. Por ejemplo, es posible que piense que tendr un ataque cardaco. Aunque las crisis de [place] pueden ser muy atemorizantes, no son potencialmente mortales. DIAGNSTICO  Las crisis de [place] se diagnostican con una evaluacin que realiza el mdico. Su mdico le realizar preguntas sobre los sntomas, como cundo y dnde ocurrieron. Tambin le preguntar sobre su historia clnica y [place] consumo de alcohol y drogas, incluidos los medicamentos recetados. Es posible que su mdico le indique anlisis de sangre u otros estudios para Museum/gallery exhibitions officer graves. El mdico podr derivarlo a un profesional de la salud mental para que le realice una evaluacin ms profunda. TRATAMIENTO   En general, las personas sanas que registran una o [place] crisis de [place] bajo una situacin extrema, potencialmente mortal, no requerirn TEFL teacher.  [place] de las crisis de [place] asociadas con trastornos de ansiedad u otras enfermedades mentales, generalmente, requiere orientacin por parte de un profesional de la salud mental medicamentos, o bien la combinacin de [place]. Su mdico le ayudar a Leisure centre manager tratamiento para usted.  Las crisis de [place] asociadas a enfermedades fsicas, generalmente, desaparecen con el tratamiento de la enfermedad. Si un medicamento recetado le causa crisis de [place], consulte a su mdico si debe suspenderlo, disminuir la dosis o sustituirlo por otro medicamento.  Las crisis de [place] asociadas al consumo de drogas o alcohol desaparecen con la abstinencia. Algunos adultos necesitan ayuda profesional para dejar de beber o de consumir drogas. INSTRUCCIONES PARA EL CUIDADO EN  EL HOGAR   Tome todos los  medicamentos como le indic el mdico.  Planifique y concurra a todas las visitas de control, segn le indique el mdico. Es importante que concurra a todas las visitas. SOLICITE ATENCIN MDICA SI:  No puede tomar los [place] Companymedicamentos como se lo han indicado.  Los sntomas no mejoran o empeoran. SOLICITE ATENCIN MDICA DE INMEDIATO SI:   Experimenta sntomas de crisis de [place] diferentes de los que presenta habitualmente.  Tiene pensamientos serios acerca de lastimarse a usted mismo o daar a Economistotras personas.  Toma medicamentos para las crisis de [place] y presenta efectos secundarios graves. ASEGRESE DE QUE:  Comprende estas instrucciones.  Controlar su afeccin.  Recibir ayuda de inmediato si no mejora o si empeora.   Esta informacin no tiene Theme park managercomo fin reemplazar el consejo del mdico. Asegrese de hacerle al mdico cualquier pregunta que tenga.   Document Released: 07/12/2005 Document Revised: 07/17/2013 Elsevier Interactive Patient Education [employer]2016 Elsevier Inc.
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
## 2016-02-19 IMAGING — US US OB LIMITED
1 series · 14 of 28 positions shown · non-contrast
Comparison: none

CLINICAL DATA: Nausea, mid abdominal pain for 1 day.

EXAM:
LIMITED OBSTETRIC ULTRASOUND

[Series 1: us ob limited · 0.25mm/px · 28 acquisitions, 14 frames shown]
[im 2/28]
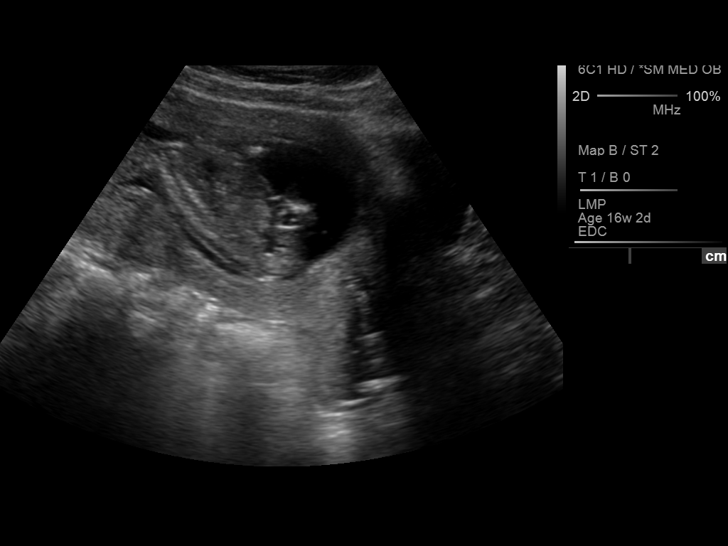
[im 4/28]
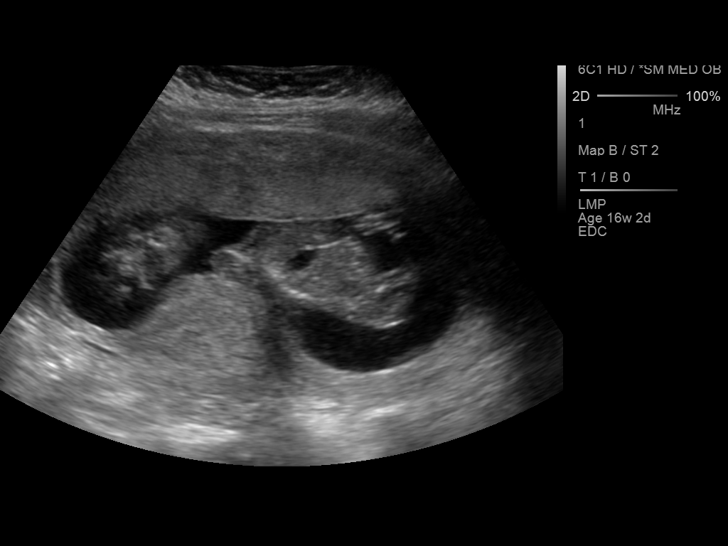
[im 6/28]
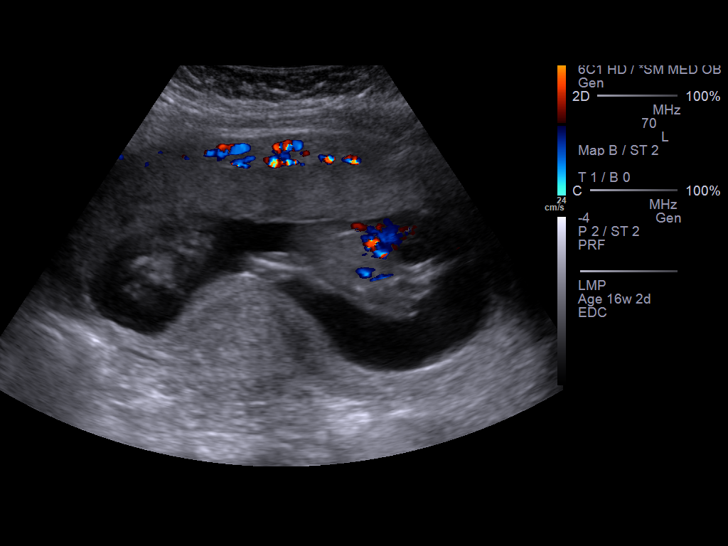
[im 8/28]
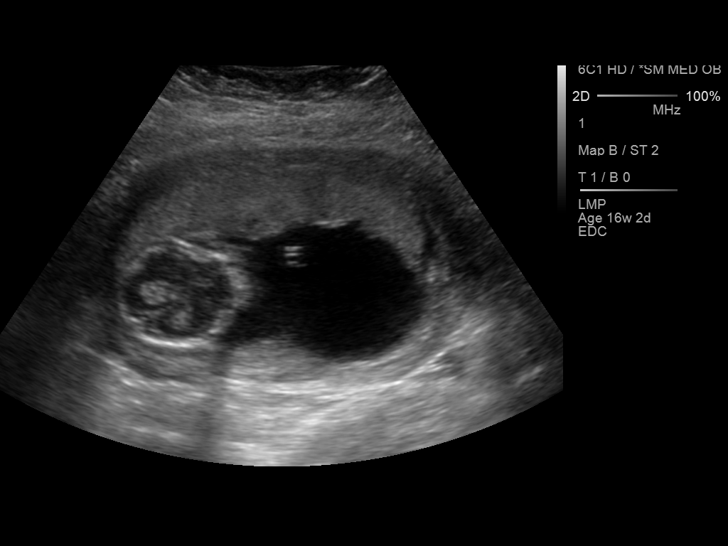
[im 10/28]
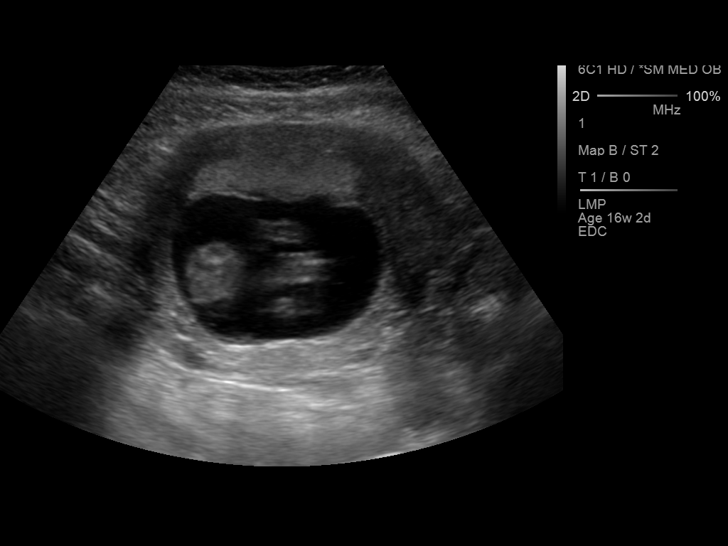
[im 12/28]
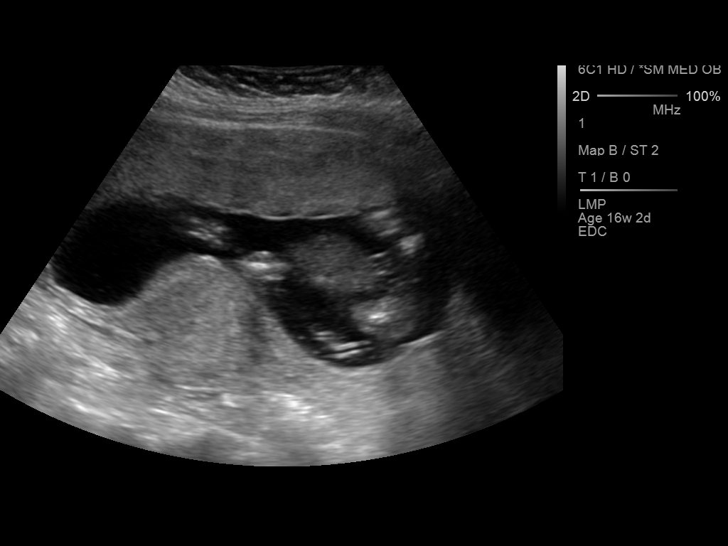
[im 14/28]
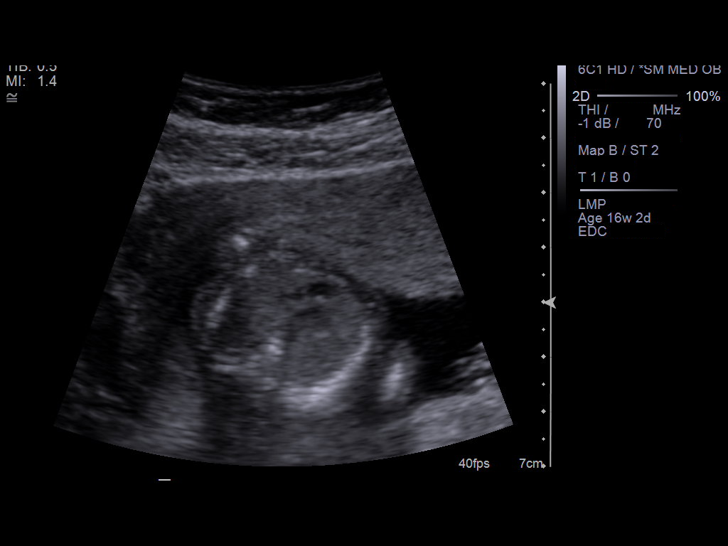
[im 16/28]
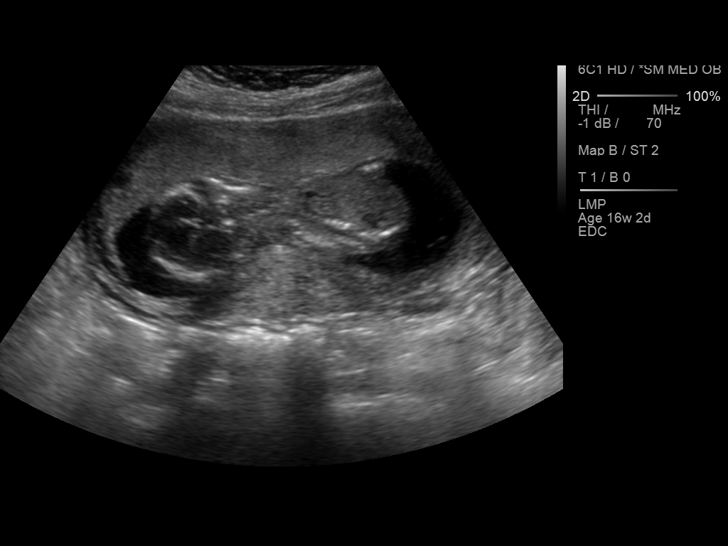
[im 18/28]
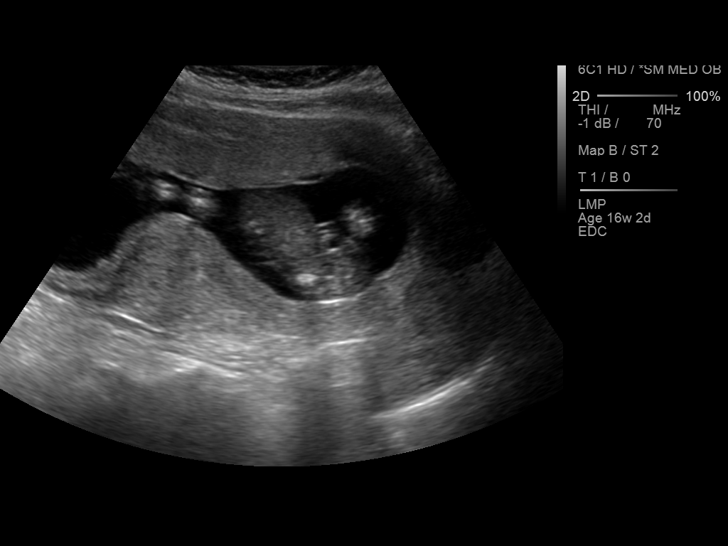
[im 20/28]
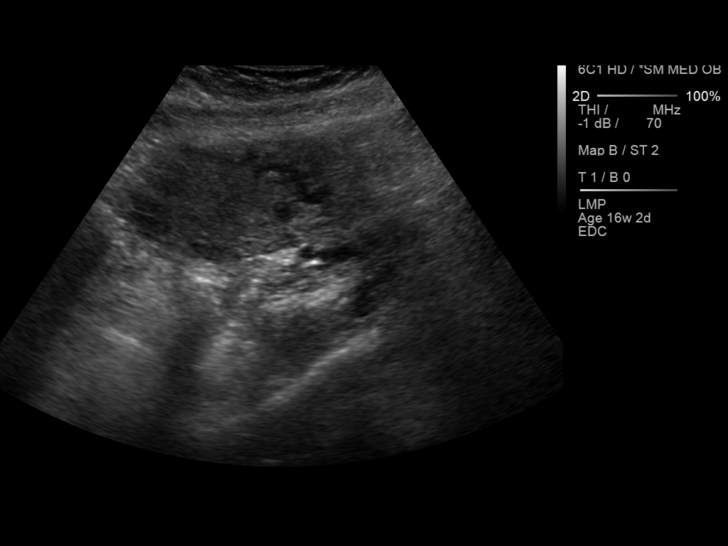
[im 22/28]
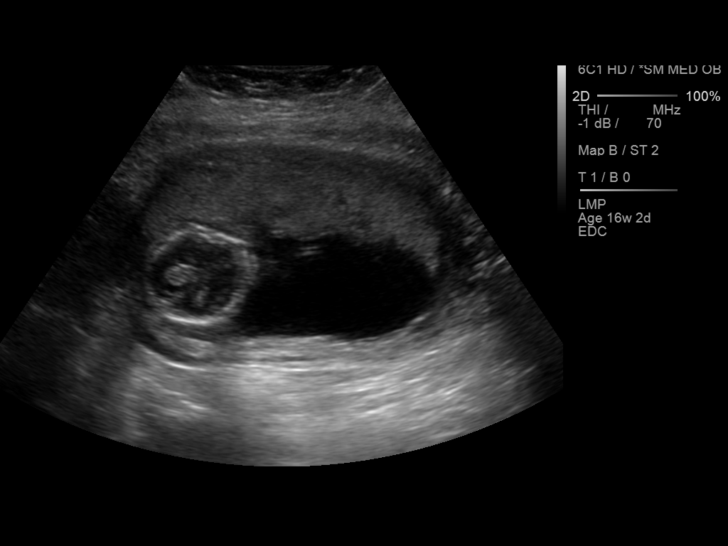
[im 24/28]
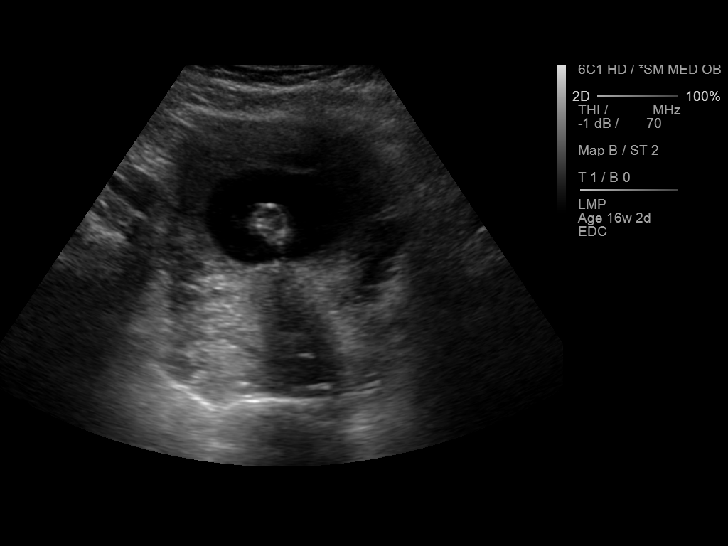
[im 26/28]
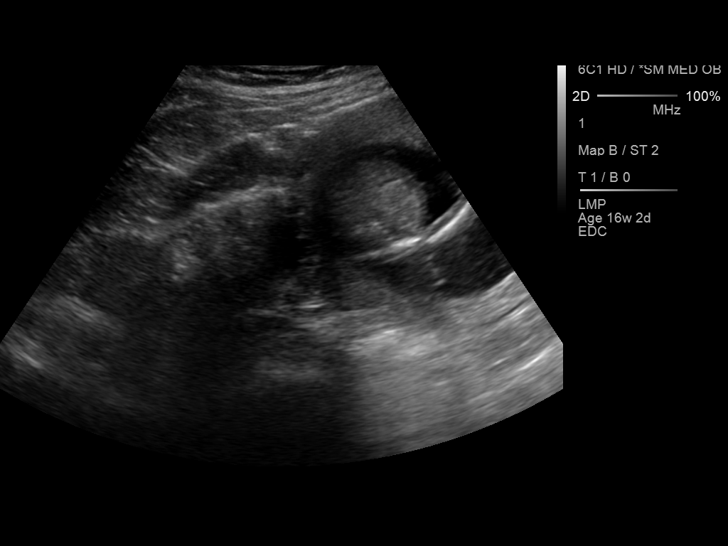
[im 28/28]
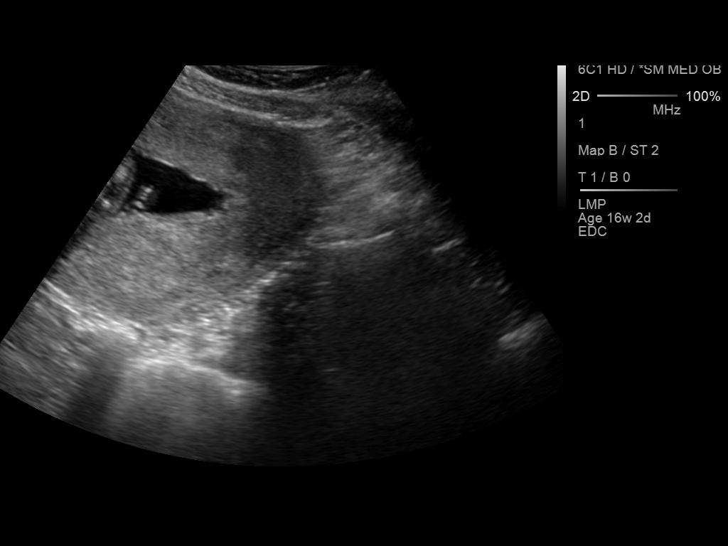

[14 of 28 positions shown; findings below may reference images not displayed]

FINDINGS: Number of Fetuses: 1

Heart Rate:  143 bpm

Movement: Yes

Presentation: Breech

Placental Location: Anterior

Previa: No

Amniotic Fluid (Subjective):  Within normal limits.

BPD:  3.3cm 16w  2d

MATERNAL FINDINGS:

Cervix:  Appears closed.

Uterus/Adnexae:  No abnormality visualized.
IMPRESSION: Approximately 16 week pregnancy. Fetal heart rate 143 beats per min.
No acute maternal findings.

This exam is performed on an emergent basis and does not
comprehensively evaluate fetal size, dating, or anatomy; follow-up
complete OB US should be considered if further fetal assessment is
warranted.

## 2018-02-17 ENCOUNTER — Emergency Department
Admission: EM | Admit: 2018-02-17 | Discharge: 2018-02-17 | Disposition: A | Discharge status: Home / Self Care | Payer: Self-pay | Attending: Emergency Medicine | Admitting: Emergency Medicine

## 2018-02-17 ENCOUNTER — Encounter: Payer: Self-pay | Admitting: Emergency Medicine

## 2018-02-17 DIAGNOSIS — G4700 Insomnia, unspecified: Secondary | ICD-10-CM

## 2018-02-17 DIAGNOSIS — F4325 Adjustment disorder with mixed disturbance of emotions and conduct: Secondary | ICD-10-CM | POA: Insufficient documentation

## 2018-02-17 DIAGNOSIS — F4323 Adjustment disorder with mixed anxiety and depressed mood: Secondary | ICD-10-CM

## 2018-02-17 DIAGNOSIS — F419 Anxiety disorder, unspecified: Secondary | ICD-10-CM

## 2018-02-17 DIAGNOSIS — F4310 Post-traumatic stress disorder, unspecified: Secondary | ICD-10-CM

## 2018-02-17 DIAGNOSIS — R45851 Suicidal ideations: Secondary | ICD-10-CM | POA: Insufficient documentation

## 2018-02-17 HISTORY — DX: Panic disorder (episodic paroxysmal anxiety): F41.0

## 2018-02-17 HISTORY — DX: Anxiety disorder, unspecified: F41.9

## 2018-02-17 LAB — URINE DRUG SCREEN, QUALITATIVE (ARMC ONLY)
Amphetamines, Ur Screen: NOT DETECTED
BARBITURATES, UR SCREEN: NOT DETECTED
Benzodiazepine, Ur Scrn: NOT DETECTED
CANNABINOID 50 NG, UR ~~LOC~~: NOT DETECTED
Cocaine Metabolite,Ur ~~LOC~~: NOT DETECTED
MDMA (Ecstasy)Ur Screen: NOT DETECTED
METHADONE SCREEN, URINE: NOT DETECTED
Opiate, Ur Screen: NOT DETECTED
Phencyclidine (PCP) Ur S: NOT DETECTED
TRICYCLIC, UR SCREEN: NOT DETECTED

## 2018-02-17 LAB — CBC
HCT: 40.8 % (ref 35.0–47.0)
Hemoglobin: 13.8 g/dL (ref 12.0–16.0)
MCH: 30.3 pg (ref 26.0–34.0)
MCHC: 33.9 g/dL (ref 32.0–36.0)
MCV: 89.4 fL (ref 80.0–100.0)
PLATELETS: 189 10*3/uL (ref 150–440)
RBC: 4.56 MIL/uL (ref 3.80–5.20)
RDW: 13.8 % (ref 11.5–14.5)
WBC: 9.6 10*3/uL (ref 3.6–11.0)

## 2018-02-17 LAB — COMPREHENSIVE METABOLIC PANEL
ALBUMIN: 4.3 g/dL (ref 3.5–5.0)
ALT: 22 U/L (ref 0–44)
AST: 20 U/L (ref 15–41)
Alkaline Phosphatase: 61 U/L (ref 38–126)
Anion gap: 8 (ref 5–15)
BILIRUBIN TOTAL: 0.4 mg/dL (ref 0.3–1.2)
BUN: 13 mg/dL (ref 6–20)
CALCIUM: 9.5 mg/dL (ref 8.9–10.3)
CHLORIDE: 108 mmol/L (ref 98–111)
CO2: 23 mmol/L (ref 22–32)
CREATININE: 0.83 mg/dL (ref 0.44–1.00)
GFR calc Af Amer: >60 mL/min (ref >60)
Glucose, Bld: 109 mg/dL — ABNORMAL HIGH (ref 70–99)
POTASSIUM: 3.8 mmol/L (ref 3.5–5.1)
Sodium: 139 mmol/L (ref 135–145)
Total Protein: 7.8 g/dL (ref 6.5–8.1)

## 2018-02-17 LAB — POCT PREGNANCY, URINE: PREG TEST UR: NEGATIVE

## 2018-02-17 LAB — ETHANOL

## 2018-02-17 LAB — SALICYLATE LEVEL

## 2018-02-17 LAB — ACETAMINOPHEN LEVEL

## 2018-02-17 MED ORDER — TRAZODONE HCL 100 MG PO TABS: 100.0000 mg | ORAL_TABLET | Freq: Every day | ORAL | 0 refills | Status: AC

## 2018-02-17 NOTE — ED Notes (Signed)
Pt remained 1:1 while in triage, waiting for room to open.
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
## 2018-02-17 NOTE — ED Notes (Signed)
Dr. Toni Amendlapacs talking with Patient with interpreter. Patient is calm and cooperative.

## 2018-02-17 NOTE — ED Notes (Signed)
Pt offered Food tray and drink. Pt didn't want food but asked for water.

## 2018-02-17 NOTE — ED Triage Notes (Addendum)
Pt arrived via EMS from home with reports of trouble breathing. Pt states she became upset after arguing with son who is 41 years old. Pt denies any physical abuse. Pt made comments about wanting to hurt herself.   Pt has plan to take pills to end her life.  Pt has no prior hx of suicide attempts.  Pt has hx of anxiety and panic attacks.  Pt currently takes medication to help with sleep.   Pt denies any hallucinations but does report to having nightmares.

## 2018-02-17 NOTE — ED Notes (Signed)
Patient transferred to room 20, report given to nurse from triage nurse.

## 2018-02-17 NOTE — ED Notes (Signed)
Patient does not have any family in the area and pt has 5 children ages 6217,16,14, 535,3 who do not have an adult at the home besides the patient.

## 2018-02-17 NOTE — ED Notes (Signed)
First Nurse Note:  Patient presents to the ED via EMS very anxious and tearful.  Patient states through a spanish interpreter to this RN that she is a single mother and she is very overwhelmed.  She is afraid of people hurting her and she is having suicidal thoughts.  Patient tearful.  Patient states she has 5 children, ages 2217, 4616, 4714, 5 and 3 and they are at home at this time.

## 2018-02-17 NOTE — ED Notes (Signed)
Patient with discharge instructions, no signs of distress, prescription given to Patient,all belongings returned, Patient called family member to come and pick up and transport home.

## 2018-02-17 NOTE — ED Notes (Signed)
Dr. Alphonzo LemmingsMcShane talking with Patient with interpreter.

## 2018-02-17 NOTE — ED Provider Notes (Signed)
[facility] Emergency Department Provider Note  ____________________________________________   I have reviewed the triage vital signs and the nursing notes. Where available I have reviewed prior notes and, if possible and indicated, outside hospital notes.    HISTORY  Chief Complaint Psychiatric Evaluation    HPI Stacy Watson is a 42 y.o. female  Stacy Watson today complaining of "I felt stressed".  History is with the interpreter.  Patient states that she has no SI or HI at this time but she felt very anxious at home.  She has no thoughts of hurting herself or anyone else at this time but she did have a moment of exasperation because she has multiple family stressors.   She is never tried to kill herself .  She does have history of adjustment disorder and anxiety, states she has taken no overdose and has no thoughts of harming self anymore.  Past Medical History:  Diagnosis Date  . Anxiety   . Panic attacks     Patient Active Problem List   Diagnosis Date Noted  . Adjustment disorder with mixed anxiety and depressed mood 02/17/2018  . Insomnia 02/17/2018  . Labor and delivery, indication for care 01/24/2015  . Indication for care or intervention related to labor and delivery 01/24/2015  . Braxton Hicks contractions 01/10/2015  . Indication for care in labor and delivery, antepartum 01/10/2015    Past Surgical History:  Procedure Laterality Date  . TUBAL LIGATION Bilateral 01/25/2015   Procedure: BILATERAL TUBAL LIGATION;  Surgeon: Wille Celeste, MD;  Location: [facility] ORS;  Service: Gynecology;  Laterality: Bilateral;    Prior to Admission medications   Medication Sig Start Date End Date Taking? Authorizing Provider  ibuprofen (MOTRIN IB) 200 MG tablet Take 3 tablets (600 mg total) by mouth every 8 (eight) hours as needed. 01/26/15   Sharee Pimple, CNM  oxyCODONE-acetaminophen (PERCOCET/ROXICET) 5-325 MG per tablet Take 1 tablet by mouth  every 4 (four) hours as needed (for pain scale 4-7). 01/26/15   Sharee Pimple, CNM  traZODone (DESYREL) 100 MG tablet Take 1 tablet (100 mg total) by mouth at bedtime. 02/17/18   Clapacs, Jackquline Denmark, MD    Allergies Patient has no known allergies.  History reviewed. No pertinent family history.  Social History Social History   Tobacco Use  . Smoking status: Never Smoker  . Smokeless tobacco: Never Used  Substance Use Topics  . Alcohol use: No  . Drug use: No    Review of Systems Constitutional: No fever/chills Eyes: No visual changes. ENT: No sore throat. No stiff neck no neck pain Cardiovascular: Denies chest pain. Respiratory: Denies shortness of breath. Gastrointestinal:   no vomiting.  No diarrhea.  No constipation. Genitourinary: Negative for dysuria. Musculoskeletal: Negative lower extremity swelling Skin: Negative for rash. Neurological: Negative for severe headaches, focal weakness or numbness.   ____________________________________________   PHYSICAL EXAM:  VITAL SIGNS: ED Triage Vitals  Enc Vitals Group     BP      Pulse      Resp      Temp      Temp src      SpO2      Weight      Height      Head Circumference      Peak Flow      Pain Score      Pain Loc      Pain Edu?      Excl. in GC?  Constitutional: Alert and oriented. Well appearing and in no acute distress. Eyes: Conjunctivae are normal Head: Atraumatic HEENT: No congestion/rhinnorhea. Mucous membranes are moist.  Oropharynx non-erythematous Neck:   Nontender with no meningismus, no masses, no stridor Cardiovascular: Normal rate, regular rhythm. Grossly normal heart sounds.  Good peripheral circulation. Respiratory: Normal respiratory effort.  No retractions. Lungs CTAB. Abdominal: Soft and nontender. No distention. No guarding no rebound Back:  There is no focal tenderness or step off.  there is no midline tenderness there are no lesions noted. there is no CVA  tenderness Musculoskeletal: No lower extremity tenderness, no upper extremity tenderness. No joint effusions, no DVT signs strong distal pulses no edema Neurologic:  Normal speech and language. No gross focal neurologic deficits are appreciated.  Skin:  Skin is warm, dry and intact. No rash noted. Psychiatric: Mood and affect are anxious. Speech and behavior are normal.  ____________________________________________   LABS (all labs ordered are listed, but only abnormal results are displayed)  Labs Reviewed  COMPREHENSIVE METABOLIC PANEL - Abnormal; Notable for the following components:      Result Value   Glucose, Bld 109 (*)    All other components within normal limits  ACETAMINOPHEN LEVEL - Abnormal; Notable for the following components:   Acetaminophen (Tylenol), Serum <10 (*)    All other components within normal limits  SALICYLATE LEVEL  CBC  URINE DRUG SCREEN, QUALITATIVE ([facility] ONLY)  ETHANOL  POCT PREGNANCY, URINE  POC URINE PREG, ED    Pertinent labs  results that were available during my care of the patient were reviewed by me and considered in my medical decision making (see chart for details). ____________________________________________  EKG  I personally interpreted any EKGs ordered by me or triage  ____________________________________________  RADIOLOGY  Pertinent labs & imaging results that were available during my care of the patient were reviewed by me and considered in my medical decision making (see chart for details). If possible, patient and/or family made aware of any abnormal findings.  No results found. ____________________________________________    PROCEDURES  Procedure(s) performed: None  Procedures  Critical Care performed: None  ____________________________________________   INITIAL IMPRESSION / ASSESSMENT AND PLAN / ED COURSE  Pertinent labs & imaging results that were available during my care of the patient were reviewed by me  and considered in my medical decision making (see chart for details).  Patient here with anxiety and fleeting thoughts of self-harm.  She contracts for safety at this time.  She was seen and evaluated by psychiatry.  Very much appreciate the consult.  He feels that she does not require inpatient admission or psychiatric disposition at this time.  We have given her extensive return precautions and follow-up.  Patient contracts for safety not only for herself but for her children.  Is very anxious to get home to take care of them she states she has a lot to live for, she states she just became very anxious.  And that there is no ongoing SI or HI, psychiatry cleared her for discharge and we see no other acute pathology today, is certainly not legal for us to keep her against her well and we will discharge her with close return precautions and follow-up given and understood    ____________________________________________   FINAL CLINICAL IMPRESSION(S) / ED DIAGNOSES  Final diagnoses:  None      This chart was dictated using voice recognition software.  Despite best efforts to proofread,  errors can occur which can change meaning.  Jeanmarie Plant, MD 02/17/18 [phone]

## 2020-10-22 ENCOUNTER — Ambulatory Visit
Admission: RE | Admit: 2020-10-22 | Discharge: 2020-10-22 | Disposition: A | Discharge status: Home / Self Care | Payer: Self-pay | Source: Ambulatory Visit | Attending: Pediatrics | Admitting: Pediatrics

## 2020-10-22 ENCOUNTER — Other Ambulatory Visit: Payer: Self-pay | Admitting: Pediatrics

## 2020-10-22 ENCOUNTER — Other Ambulatory Visit: Payer: Self-pay

## 2020-10-22 DIAGNOSIS — R609 Edema, unspecified: Secondary | ICD-10-CM

## 2020-10-22 DIAGNOSIS — R52 Pain, unspecified: Secondary | ICD-10-CM

## 2023-02-16 ENCOUNTER — Encounter: Payer: Self-pay | Admitting: Physician Assistant

## 2023-04-21 ENCOUNTER — Other Ambulatory Visit: Payer: Self-pay

## 2023-04-21 DIAGNOSIS — Z1231 Encounter for screening mammogram for malignant neoplasm of breast: Secondary | ICD-10-CM

## 2023-04-29 ENCOUNTER — Other Ambulatory Visit: Payer: Self-pay

## 2023-04-29 ENCOUNTER — Emergency Department
Admission: EM | Admit: 2023-04-29 | Discharge: 2023-04-29 | Disposition: A | Discharge status: Home / Self Care | Payer: Self-pay | Attending: Emergency Medicine | Admitting: Emergency Medicine

## 2023-04-29 ENCOUNTER — Emergency Department: Payer: Self-pay

## 2023-04-29 DIAGNOSIS — W01198A Fall on same level from slipping, tripping and stumbling with subsequent striking against other object, initial encounter: Secondary | ICD-10-CM | POA: Insufficient documentation

## 2023-04-29 DIAGNOSIS — M79621 Pain in right upper arm: Secondary | ICD-10-CM | POA: Insufficient documentation

## 2023-04-29 DIAGNOSIS — M25551 Pain in right hip: Secondary | ICD-10-CM | POA: Insufficient documentation

## 2023-04-29 DIAGNOSIS — Y990 Civilian activity done for income or pay: Secondary | ICD-10-CM | POA: Insufficient documentation

## 2023-04-29 DIAGNOSIS — M25511 Pain in right shoulder: Secondary | ICD-10-CM | POA: Insufficient documentation

## 2023-04-29 DIAGNOSIS — R55 Syncope and collapse: Secondary | ICD-10-CM | POA: Insufficient documentation

## 2023-04-29 DIAGNOSIS — W19XXXA Unspecified fall, initial encounter: Secondary | ICD-10-CM

## 2023-04-29 DIAGNOSIS — M25521 Pain in right elbow: Secondary | ICD-10-CM | POA: Insufficient documentation

## 2023-04-29 LAB — BASIC METABOLIC PANEL
Anion gap: 6 (ref 5–15)
BUN: 10 mg/dL (ref 6–20)
CO2: 22 mmol/L (ref 22–32)
Calcium: 8.8 mg/dL — ABNORMAL LOW (ref 8.9–10.3)
Chloride: 108 mmol/L (ref 98–111)
Creatinine, Ser: 0.75 mg/dL (ref 0.44–1.00)
GFR, Estimated: >60 mL/min (ref >60)
Glucose, Bld: 135 mg/dL — ABNORMAL HIGH (ref 70–99)
Potassium: 3.4 mmol/L — ABNORMAL LOW (ref 3.5–5.1)
Sodium: 136 mmol/L (ref 135–145)

## 2023-04-29 LAB — CBC
HCT: 37 % (ref 36.0–46.0)
Hemoglobin: 12.3 g/dL (ref 12.0–15.0)
MCH: 29.2 pg (ref 26.0–34.0)
MCHC: 33.2 g/dL (ref 30.0–36.0)
MCV: 87.9 fL (ref 80.0–100.0)
Platelets: 200 10*3/uL (ref 150–400)
RBC: 4.21 MIL/uL (ref 3.87–5.11)
RDW: 13.9 % (ref 11.5–15.5)
WBC: 7.2 10*3/uL (ref 4.0–10.5)
nRBC: 0 % (ref 0.0–0.2)

## 2023-04-29 LAB — HCG, QUANTITATIVE, PREGNANCY: hCG, Beta Chain, Quant, S: <1 m[IU]/mL (ref <5)

## 2023-04-29 MED ORDER — LACTATED RINGERS IV BOLUS
1000.0000 mL | Freq: Once | INTRAVENOUS | Status: AC
  Administered 2023-04-29: 1000 mL via INTRAVENOUS

## 2023-04-29 MED ORDER — MORPHINE SULFATE (PF) 2 MG/ML IV SOLN
2.0000 mg | Freq: Once | INTRAVENOUS | Status: AC
  Administered 2023-04-29: 2 mg via INTRAVENOUS
  Filled 2023-04-29: qty 1

## 2023-04-29 NOTE — ED Notes (Signed)
MD at bedside. 

## 2023-04-29 NOTE — ED Notes (Signed)
Pt placed by on monitor by EDT KeKe. Pt given warm blanket. Family at bedside and updated on plan of care.

## 2023-04-29 NOTE — Discharge Instructions (Signed)
Please follow-up with your primary care provider for reassessment early next week.  You can take a couple of days off work to help with your pain symptoms.  You can take Tylenol and ibuprofen to help with pain.

## 2023-04-29 NOTE — ED Triage Notes (Signed)
Pt comes via EMs from work with c/o fall. Pt had syncopal episode and then fell. Pt states she did hit her head. Pt states headache. Pt was in the bathroom on floor found by daughter. Pt is not on thinners.  Pt states right arm pain, left side of face. Cpsine cleared by ems.

## 2023-04-29 NOTE — ED Provider Notes (Signed)
[facility] Provider Note    Event Date/Time   First MD Initiated Contact with Patient 04/29/23 [phone]     (approximate)   History   Fall   HPI Stacy Watson is a 47 y.o. female with history of anxiety, panic attacks presenting today for fall.  Patient was reportedly at work when she had episode of lightheadedness associated with nausea and then reportedly passed out.  Found down on the ground by daughter.  No confusion around the event.  Does noted pain to the face, right shoulder, right upper arm, right elbow, right hip.  Denies pain anywhere else.  Otherwise asymptomatic at this time and no associated chest pain, shortness of breath, abdominal pain.     Physical Exam   Triage Vital Signs: ED Triage Vitals [04/29/23 0754]  Encounter Vitals Group     BP      Systolic BP Percentile      Diastolic BP Percentile      Pulse      Resp      Temp      Temp src      SpO2      Weight      Height      Head Circumference      Peak Flow      Pain Score 5     Pain Loc      Pain Education      Exclude from Growth Chart     Most recent vital signs: Vitals:   04/29/23 0759  BP: (!) 115/57  Pulse: 87  Resp: 20  Temp: 98.3 F (36.8 C)  SpO2: 100%    Physical Exam: I have reviewed the vital signs and nursing notes. General: Awake, alert, no acute distress.  Nontoxic appearing. Head:  Atraumatic, normocephalic.   ENT:  EOM intact, PERRL. Oral mucosa is pink and moist with no lesions. Neck: Neck is supple with full range of motion, No meningeal signs. Cardiovascular:  RRR, No murmurs. Peripheral pulses palpable and equal bilaterally. Respiratory:  Symmetrical chest wall expansion.  No rhonchi, rales, or wheezes.  Good air movement throughout.  No use of accessory muscles.   Musculoskeletal:  No cyanosis or edema. Moving extremities with full ROM.  Mild tenderness palpation at anterior right shoulder, right humerus, and right elbow. Abdomen:   Soft, nontender, nondistended. Neuro:  GCS 15, moving all four extremities, interacting appropriately. Speech clear. Psych:  Calm, appropriate.   Skin:  Warm, dry, no rash.    ED Results / Procedures / Treatments   Labs (all labs ordered are listed, but only abnormal results are displayed) Labs Reviewed  BASIC METABOLIC PANEL - Abnormal; Notable for the following components:      Result Value   Potassium 3.4 (*)    Glucose, Bld 135 (*)    Calcium 8.8 (*)    All other components within normal limits  CBC  HCG, QUANTITATIVE, PREGNANCY  CBG MONITORING, ED     EKG My EKG interpretation: Rate of 89, normal sinus rhythm, normal axis, normal intervals.  No acute ST elevations or depressions.   RADIOLOGY Independently interpreted CT imaging and x-rays with no acute pathology   PROCEDURES:  Critical Care performed: No  Procedures   MEDICATIONS ORDERED IN ED: Medications  lactated ringers bolus 1,000 mL (1,000 mLs Intravenous New Bag/Given 04/29/23 0859)  morphine (PF) 2 MG/ML injection 2 mg (2 mg Intravenous Given 04/29/23 0914)     IMPRESSION / MDM /  ASSESSMENT AND PLAN / ED COURSE  I reviewed the triage vital signs and the nursing notes.                              Differential diagnosis includes, but is not limited to, ICH, soft tissue hematoma, humerus fracture, elbow fracture versus dislocation, vasovagal syncope.  Patient's presentation is most consistent with acute complicated illness / injury requiring diagnostic workup.  Patient is a 47 year old female presenting today for syncopal episode with injury to her head and right sided extremities.  Vital signs stable on arrival and patient asymptomatic other than pain complaints.  No obvious traumatic injuries or deformities noted.  CT imaging of the head shows no acute pathology.  Laboratory workup reassuring.  X-rays of right shoulder, right humerus, and right hip show no acute pathology.  Patient was given 1 L fluids  and pain medication with improvement in symptoms.  Suspect symptoms today likely vasovagal in the setting of standing up with associated lightheadedness.  Otherwise asymptomatic and safe for discharge at this time with outpatient follow-up with her PCP.  Given strict return precautions.  The patient is on the cardiac monitor to evaluate for evidence of arrhythmia and/or significant heart rate changes. Clinical Course as of 04/29/23 1011  Fri Apr 29, 2023  0823 CBC Unremarkable [DW]  [phone] Independently interpreted CT head, right shoulder x-ray, right humerus x-ray, and right hip x-ray with no acute pathology [DW]  0857 Basic metabolic panel(!) Mild hypokalemia otherwise unremarkable [DW]    Clinical Course User Index [DW] Janith Lima, MD     FINAL CLINICAL IMPRESSION(S) / ED DIAGNOSES   Final diagnoses:  Fall, initial encounter  Syncope, unspecified syncope type     Rx / DC Orders   ED Discharge Orders     None        Note:  This document was prepared using Dragon voice recognition software and may include unintentional dictation errors.   Janith Lima, MD 04/29/23 1013

## 2023-04-29 NOTE — ED Notes (Signed)
Pt in xray and CT

## 2023-05-09 ENCOUNTER — Ambulatory Visit: Payer: Self-pay | Attending: Hematology and Oncology | Admitting: Hematology and Oncology

## 2023-05-09 ENCOUNTER — Ambulatory Visit
Admission: RE | Admit: 2023-05-09 | Discharge: 2023-05-09 | Disposition: A | Discharge status: Home / Self Care | Payer: Self-pay | Source: Ambulatory Visit | Attending: Obstetrics and Gynecology | Admitting: Obstetrics and Gynecology

## 2023-05-09 VITALS — BP 120/54 | Wt 168.4 lb

## 2023-05-09 DIAGNOSIS — Z1231 Encounter for screening mammogram for malignant neoplasm of breast: Secondary | ICD-10-CM

## 2023-05-09 NOTE — Patient Instructions (Signed)
Taught Stacy Watson about self breast awareness and gave educational materials to take home. Patient did not need a Pap smear today due to last Pap smear was in 11/30/2021 per patient.  Let her know [facility] will cover Pap smears every 5 years unless has a history of abnormal Pap smears. Referred patient to the [facility] for screening mammogram. Appointment scheduled for 05/09/2023. Patient aware of appointment and will be there. Let patient know will follow up with her within the next couple weeks with results. Stacy Watson verbalized understanding.  Pascal Lux, NP 9:33 AM

## 2023-05-09 NOTE — Progress Notes (Signed)
Ms. Stacy Watson Stacy Watson is a 46 y.o. female who presents to [facility] clinic today with no complaints.    Pap Smear: Pap not smear completed today. Last Pap smear was 11/30/2021 at [facility] clinic and was normal. Per patient has no history of an abnormal Pap smear. Last Pap smear result is available in Epic.   Physical exam: Breasts Breasts symmetrical. No skin abnormalities bilateral breasts. No nipple retraction bilateral breasts. No nipple discharge bilateral breasts. No lymphadenopathy. No lumps palpated bilateral breasts.        Pelvic/Bimanual Pap is not indicated today    Smoking History: Patient has never smoked and was not referred to quit line.    Patient Navigation: Patient education provided. Access to services provided for patient through [facility] program. [name] interpreter provided. No transportation provided   Colorectal Cancer Screening: Per patient has never had colonoscopy completed No complaints today. FIT test completed 02/17/23   Breast and Cervical Cancer Risk Assessment: Patient does not have family history of breast cancer, known genetic mutations, or radiation treatment to the chest before age 24. Patient does not have history of cervical dysplasia, immunocompromised, or DES exposure in-utero.   Risk Scores as of Encounter on 05/09/2023     Stacy Watson           5-year 0.69%   Lifetime 7.79%   This patient is [place]/Lat[place] but has no documented birth country, so the [place] model used data from [name] patients to calculate their risk score. Document a birth country in the Demographics activity for a more accurate score.         Last calculated by Stacy Rutherford, LPN on 40/98/1191 at  9:58 AM         A: [facility] exam without pap smear No complaints with benign exam.   P: Referred patient to the [facility] for a screening mammogram. Appointment scheduled 05/09/2023.  Ilda Basset A, NP 05/09/2023 9:22 AM

## 2023-05-11 ENCOUNTER — Other Ambulatory Visit: Payer: Self-pay

## 2023-05-11 DIAGNOSIS — R928 Other abnormal and inconclusive findings on diagnostic imaging of breast: Secondary | ICD-10-CM

## 2023-05-12 ENCOUNTER — Encounter: Payer: Self-pay | Admitting: Obstetrics and Gynecology

## 2023-05-12 DIAGNOSIS — R928 Other abnormal and inconclusive findings on diagnostic imaging of breast: Secondary | ICD-10-CM

## 2023-05-17 ENCOUNTER — Ambulatory Visit
Admission: RE | Admit: 2023-05-17 | Discharge: 2023-05-17 | Disposition: A | Discharge status: Home / Self Care | Payer: Self-pay | Source: Ambulatory Visit | Attending: Obstetrics and Gynecology | Admitting: Obstetrics and Gynecology

## 2023-05-17 DIAGNOSIS — R928 Other abnormal and inconclusive findings on diagnostic imaging of breast: Secondary | ICD-10-CM

## 2024-05-31 ENCOUNTER — Emergency Department: Payer: Self-pay

## 2024-05-31 ENCOUNTER — Other Ambulatory Visit: Payer: Self-pay

## 2024-05-31 ENCOUNTER — Emergency Department
Admission: EM | Admit: 2024-05-31 | Discharge: 2024-05-31 | Disposition: A | Discharge status: Home / Self Care | Payer: Self-pay | Attending: Emergency Medicine | Admitting: Emergency Medicine

## 2024-05-31 DIAGNOSIS — R002 Palpitations: Secondary | ICD-10-CM | POA: Insufficient documentation

## 2024-05-31 DIAGNOSIS — R0789 Other chest pain: Secondary | ICD-10-CM | POA: Insufficient documentation

## 2024-05-31 DIAGNOSIS — R079 Chest pain, unspecified: Secondary | ICD-10-CM

## 2024-05-31 LAB — BASIC METABOLIC PANEL WITH GFR
Anion gap: 8 (ref 5–15)
BUN: 13 mg/dL (ref 6–20)
CO2: 22 mmol/L (ref 22–32)
Calcium: 8.8 mg/dL — ABNORMAL LOW (ref 8.9–10.3)
Chloride: 109 mmol/L (ref 98–111)
Creatinine, Ser: 0.67 mg/dL (ref 0.44–1.00)
GFR, Estimated: >60 mL/min (ref >60)
Glucose, Bld: 84 mg/dL (ref 70–99)
Potassium: 3.7 mmol/L (ref 3.5–5.1)
Sodium: 139 mmol/L (ref 135–145)

## 2024-05-31 LAB — TROPONIN I (HIGH SENSITIVITY): Troponin I (High Sensitivity): <2 ng/L (ref <18)

## 2024-05-31 LAB — CBC
HCT: 39.3 % (ref 36.0–46.0)
Hemoglobin: 13.3 g/dL (ref 12.0–15.0)
MCH: 30.6 pg (ref 26.0–34.0)
MCHC: 33.8 g/dL (ref 30.0–36.0)
MCV: 90.6 fL (ref 80.0–100.0)
Platelets: 228 K/uL (ref 150–400)
RBC: 4.34 MIL/uL (ref 3.87–5.11)
RDW: 13.8 % (ref 11.5–15.5)
WBC: 9.1 K/uL (ref 4.0–10.5)
nRBC: 0 % (ref 0.0–0.2)

## 2024-05-31 MED ORDER — FAMOTIDINE 20 MG PO TABS
40.0000 mg | ORAL_TABLET | Freq: Once | ORAL | Status: AC
  Administered 2024-05-31: 40 mg via ORAL
  Filled 2024-05-31: qty 2

## 2024-05-31 MED ORDER — HYDROXYZINE HCL 10 MG PO TABS: 10.0000 mg | ORAL_TABLET | Freq: Three times a day (TID) | ORAL | 0 refills | Status: AC | PRN

## 2024-05-31 MED ORDER — FAMOTIDINE 20 MG PO TABS: 20.0000 mg | ORAL_TABLET | Freq: Two times a day (BID) | ORAL | 0 refills | Status: AC

## 2024-05-31 MED ORDER — CALCIUM CARBONATE ANTACID 500 MG PO CHEW
1.0000 | CHEWABLE_TABLET | Freq: Once | ORAL | Status: AC
  Administered 2024-05-31: 200 mg via ORAL
  Filled 2024-05-31: qty 1

## 2024-05-31 NOTE — ED Provider Notes (Signed)
 [facility] Provider Note    Event Date/Time   First MD Initiated Contact with Patient 05/31/24 [phone]     (approximate)   History   Chief Complaint: Chest Pain   HPI  Stacy Watson is a 48 y.o. female with a history of anxiety comes ED complaining of central chest discomfort described as tightness and palpitations for the last 3 days.  She notes that she has been very stressed recently because yesterday one of her children had a court appearance and was given probation and today her other child had a court appearance and was given probation.  She does endorse increased symptoms with walking, relieved with rest.  Denies any other known medical history, no history of CAD or heart attacks.        Past Medical History:  Diagnosis Date   Anxiety    Panic attacks     Current Outpatient Rx   Order #: [id number] Class: Print   Order #: [id number] Class: Print   Order #: [id number] Class: Normal   Order #: [id number] Class: Print   Order #: [id number] Class: Print    Past Surgical History:  Procedure Laterality Date   TUBAL LIGATION Bilateral 01/25/2015   Procedure: BILATERAL TUBAL LIGATION;  Surgeon: Alm Ozell Eagles, MD;  Location: [facility] ORS;  Service: Gynecology;  Laterality: Bilateral;    Physical Exam   Triage Vital Signs: ED Triage Vitals  Encounter Vitals Group     BP 05/31/24 0908 116/75     Girls Systolic BP Percentile --      Girls Diastolic BP Percentile --      Boys Systolic BP Percentile --      Boys Diastolic BP Percentile --      Pulse Rate 05/31/24 0908 85     Resp 05/31/24 0908 16     Temp 05/31/24 0908 98.3 F (36.8 C)     Temp Source 05/31/24 0908 Oral     SpO2 05/31/24 0908 99 %     Weight 05/31/24 0909 168 lb 6.9 oz (76.4 kg)     Height --      Head Circumference --      Peak Flow --      Pain Score 05/31/24 0909 5     Pain Loc --      Pain Education --      Exclude from Growth Chart --     Most recent vital  signs: Vitals:   05/31/24 1100 05/31/24 1130  BP: (!) 107/48 (!) 118/44  Pulse: 83 81  Resp: 20   Temp:    SpO2: 100% 100%    General: Awake, no distress.  CV:  Good peripheral perfusion.  Regular rate rhythm Resp:  Normal effort.  Lungs Abd:  No distention.  Soft nontender Other:  Moist oral mucosa.  No lower extremity edema or calf tenderness   ED Results / Procedures / Treatments   Labs (all labs ordered are listed, but only abnormal results are displayed) Labs Reviewed  BASIC METABOLIC PANEL WITH GFR - Abnormal; Notable for the following components:      Result Value   Calcium 8.8 (*)    All other components within normal limits  CBC  POC URINE PREG, ED  TROPONIN I (HIGH SENSITIVITY)  TROPONIN I (HIGH SENSITIVITY)     EKG Interpreted by me Sinus rhythm rate of 95.  Normal axis intervals QRS ST segments T waves   RADIOLOGY Chest x-ray interpreted by me, unremarkable.  Radiology report reviewed  PROCEDURES:  Procedures   MEDICATIONS ORDERED IN ED: Medications  calcium carbonate (TUMS - dosed in mg elemental calcium) chewable tablet 200 mg of elemental calcium (200 mg of elemental calcium Oral Given 05/31/24 1110)  famotidine (PEPCID) tablet 40 mg (40 mg Oral Given 05/31/24 1110)     IMPRESSION / MDM / ASSESSMENT AND PLAN / ED COURSE  I reviewed the triage vital signs and the nursing notes.  DDx: GERD, anxiety, angina, AKI, joint derangement, anemia  Patient's presentation is most consistent with acute presentation with potential threat to life or bodily function.  Patient presents with intermittent chest discomfort, currently resolved.  Been going on for last few days in the setting of severe stress from her children's legal troubles.  EKG chest x-ray and labs drawn normal today.  Stable for discharge.  Cardiology referral requested.       FINAL CLINICAL IMPRESSION(S) / ED DIAGNOSES   Final diagnoses:  Nonspecific chest pain     Rx / DC  Orders   ED Discharge Orders          Ordered    famotidine (PEPCID) 20 MG tablet  2 times daily        05/31/24 1110    hydrOXYzine (ATARAX) 10 MG tablet  3 times daily PRN        05/31/24 1110    Ambulatory referral to Cardiology       Comments: If you have not heard from the Cardiology office within the next 72 hours please call [phone].   05/31/24 1138             Note:  This document was prepared using Dragon voice recognition software and may include unintentional dictation errors.   Viviann Pastor, MD 05/31/24 1319

## 2024-05-31 NOTE — ED Notes (Addendum)
 SABRA

## 2024-05-31 NOTE — Discharge Instructions (Addendum)
 Your EG, chest x-ray, and lab tests are all normal today.  Take antacid medicine and follow-up with your doctor for further evaluation of your symptoms.

## 2024-05-31 NOTE — ED Triage Notes (Addendum)
 C/O chest pain and palpitations since Tuesday. Also c/o headache, left eye pain and left upper lip fullness  AAOx3. Skin warm and dry. NAD. NO SOB/DOE

## 2024-05-31 NOTE — ED Notes (Signed)
Pt given snack box and water-

## 2024-06-16 NOTE — Progress Notes (Unsigned)
  Cardiology Office Note   Date:  06/18/2024  ID:  Nataleigh, Griffin 1976/03/03, MRN [id number] PCP: Cecily Katz, PA-C  [facility] Providers Cardiologist:  Caron Poser, MD     History of Present Illness Stacy Watson is a 48 y.o. female no relevant PMH who presents for further evaluation and management of chest discomfort.  Patient seen in the ED 05/31/2024 for chest discomfort.  At that time she reported a significant amount of family stress that was occurring.  Workup including troponin, CBC, BMP was unremarkable.  She was felt to be low risk so discharged home.  On interview today, patient reports that the pain has eased up.  She describes it as pressure-like.  It is still present.  She notes that it seems to be worse with position, especially laying down.  She notes that it takes her some time to find the exact right position where the pain is not present.  She denies any syncope.  Denies any palpitations.  Denies any orthopnea or signs or symptoms of heart failure.  Relevant CVD History -None   ROS: Pt denies any chest discomfort, jaw pain, arm pain, palpitations, syncope, presyncope, orthopnea, PND, or LE edema.  Studies Reviewed I have independently reviewed the patient's ECG, previous medical records, recent blood work.  Physical Exam VS:  BP 126/80 (BP Location: Left Arm, Patient Position: Sitting, Cuff Size: Large)   Pulse 64   Wt 172 lb (78 kg)   LMP 05/14/2024   SpO2 99%   BMI 30.47 kg/m        Wt Readings from Last 3 Encounters:  06/18/24 172 lb (78 kg)  05/31/24 168 lb 6.9 oz (76.4 kg)  05/09/23 168 lb 6.4 oz (76.4 kg)    GEN: No acute distress. NECK: No JVD; No carotid bruits. CARDIAC: RRR, no murmurs, rubs, gallops. RESPIRATORY:  Clear to auscultation. EXTREMITIES:  Warm and well-perfused. No edema.  ASSESSMENT AND PLAN Chest discomfort LVH on ECG Patient presents with chest discomfort that has been going on for over a  week.  She ruled out for MI in the ED.  ECG shows sinus rhythm with LVH.  There is a positional quality to the pain.  Seems most consistent with a musculoskeletal etiology, but a little concerning that it has not improved so far.  I discussed with her the potential for pericarditis.  Also discussed ruling out things such as CAD.  She is amenable to doing all of this, but she has a severe needle phobia so she is going to come back and do them all at a later time when she is ready.  Plan: - Coronary CT angiogram to rule out CAD - Echocardiogram to evaluate for effusion given potential for pericarditis and to also further evaluate LVH seen on ECG - ESR, CRP - Advised taking anti-inflammatories as needed in the meantime        Dispo: RTC as needed based on test results  Signed, Caron Poser, MD

## 2024-06-18 ENCOUNTER — Ambulatory Visit: Payer: Self-pay

## 2024-06-18 VITALS — BP 126/80 | HR 64 | Wt 172.0 lb

## 2024-06-18 DIAGNOSIS — Z7189 Other specified counseling: Secondary | ICD-10-CM

## 2024-06-18 DIAGNOSIS — R079 Chest pain, unspecified: Secondary | ICD-10-CM

## 2024-06-18 DIAGNOSIS — I517 Cardiomegaly: Secondary | ICD-10-CM

## 2024-06-18 DIAGNOSIS — I319 Disease of pericardium, unspecified: Secondary | ICD-10-CM

## 2024-06-18 MED ORDER — METOPROLOL TARTRATE 50 MG PO TABS
50.0000 mg | ORAL_TABLET | Freq: Once | ORAL | 0 refills | Status: AC
Start: 2024-06-18 — End: 2024-06-18

## 2024-06-18 NOTE — Patient Instructions (Addendum)
 Medication Instructions:   Your physician recommends that you continue on your current medications as directed. Please refer to the Current Medication list given to you today.  *If you need a refill on your cardiac medications before your next appointment, please call your pharmacy*  Lab Work:  Your provider would like for you to return to have the following labs drawn.   -BMP- MUST HAVE BEFORE CT CAN BE COMPLETED -C reactive protein -Sedimentation Rate   Please go to [facility] [address] ([facility]) [address], [place] [address] You do not need an appointment.  They are open from 8 am- 4:30 pm.  Lunch from 1:00 pm- 2:00 pm   You may also go to one of the following [facility]:  [address] [place], [place] [address] Phone: [phone] Lab hours: Mon-Fri 8 am- 5 pm    Lunch 12 pm- 1 pm  [address] [place],  [place]  [address]  [place]  Phone: [phone] Lab hours: 7 am- 4 pm Lunch 12 pm-1 pm   [address] [place],  [place]  [address]  [place]  Phone: [phone] Lab hours: Mon-Fri 8 am- 5 pm    Lunch 12 pm- 1 pm  If you have labs (blood work) drawn today and your tests are completely normal, you will receive your results only by: MyChart Message (if you have MyChart) OR A paper copy in the mail If you have any lab test that is abnormal or we need to change your treatment, we will call you to review the results.  Testing/Procedures:    Your cardiac CT will be scheduled at one of the below locations:     [facility] [address] [place], [place] [address] [phone]   If scheduled at [facility], please arrive at the [facility] and Children's Entrance (Entrance C2) of [facility] 30 minutes prior to test start time. You can use the FREE valet parking offered at entrance C (encouraged to control the heart rate for the test)  Proceed to the [facility] Radiology Department (first floor) to check-in  and test prep.   If scheduled at [facility], please arrive to the Heart and Vascular Center 15 mins early for check-in and test prep.  There is spacious parking and easy access to the radiology department from the [facility] entrance. Please enter here and check-in with the desk attendant.    Please follow these instructions carefully (unless otherwise directed):  An IV will be required for this test and Nitroglycerin will be given.   On the Night Before the Test: Be sure to Drink plenty of water. Do not consume any caffeinated/decaffeinated beverages or chocolate 12 hours prior to your test. Do not take any antihistamines 12 hours prior to your test.  On the Day of the Test: Drink plenty of water until 1 hour prior to the test. Do not eat any food 1 hour prior to test. You may take your regular medications prior to the test.  Take metoprolol  (Lopressor ) 50 MG tablet once- two hours prior to test. If you take Furosemide/Hydrochlorothiazide/Spironolactone/Chlorthalidone, please HOLD on the morning of the test. Patients who wear a continuous glucose monitor MUST remove the device prior to scanning. FEMALES- please wear underwire-free bra if available, avoid dresses & tight clothing  After the Test: Drink plenty of water. After receiving IV contrast, you may experience a mild flushed feeling. This is normal. On occasion, you may experience a  mild rash up to 24 hours after the test. This is not dangerous. If this occurs, you can take Benadryl  25 mg, Zyrtec, Claritin, or Allegra and increase your fluid intake. (Patients taking Tikosyn should avoid Benadryl , and may take Zyrtec, Claritin, or Allegra) If you experience trouble breathing, this can be serious. If it is severe call 911 IMMEDIATELY. If it is mild, please call our office.  We will call to schedule your test 2-4 weeks out understanding that some insurance companies will need an authorization  prior to the service being performed.   For more information and frequently asked questions, please visit our website : [email or web address]  For non-scheduling related questions, please contact the cardiac imaging nurse navigator should you have any questions/concerns: Cardiac Imaging Nurse Navigators Direct Office Dial: [phone]   For scheduling needs, including cancellations and rescheduling, please call Brittany, [phone].  Your physician has requested that you have an echocardiogram. Echocardiography is a painless test that uses sound waves to create images of your heart. It provides your doctor with information about the size and shape of your heart and how well your heart's chambers and valves are working.   You may receive an ultrasound enhancing agent through an IV if needed to better visualize your heart during the echo. This procedure takes approximately one hour.  There are no restrictions for this procedure.  This will take place at [address] [facility]) [address], [place] [address]  Please note: We ask at that you not bring children with you during ultrasound (echo/ vascular) testing. Due to room size and safety concerns, children are not allowed in the ultrasound rooms during exams. Our front office staff cannot provide observation of children in our lobby area while testing is being conducted. An adult accompanying a patient to their appointment will only be allowed in the ultrasound room at the discretion of the ultrasound technician under special circumstances. We apologize for any inconvenience.   Follow-Up: At [facility], you and your health needs are our priority.  As part of our continuing mission to provide you with exceptional heart care, our providers are all part of one team.  This team includes your primary Cardiologist (physician) and Advanced Practice Providers or APPs (Physician Assistants and Nurse Practitioners) who  all work together to provide you with the care you need, when you need it.  Your next appointment:     Follow up post testing or as needed, if symptoms settle  Provider:   You may see Caron Poser, MD or one of the following Advanced Practice Providers on your designated Care Team:   Lonni Meager, NP Lesley Maffucci, PA-C Bernardino Bring, PA-C Cadence Bruceton, PA-C Tylene Lunch, NP Barnie Hila, NP   We recommend signing up for the patient portal called MyChart.  Sign up information is provided on this After Visit Summary.  MyChart is used to connect with patients for Virtual Visits (Telemedicine).  Patients are able to view lab/test results, encounter notes, upcoming appointments, etc.  Non-urgent messages can be sent to your provider as well.   To learn more about what you can do with MyChart, go to [email or web address].

## 2024-07-30 ENCOUNTER — Ambulatory Visit: Payer: Self-pay
